# Patient Record
Sex: Female | Born: 1935 | Race: White | Hispanic: No | Marital: Married | Smoking: Never smoker
Health system: Southern US, Community
[De-identification: ages and names within clinical notes are randomized; demographics above are authoritative.]

## PROBLEM LIST (undated history)

## (undated) ENCOUNTER — Emergency Department (HOSPITAL_BASED_OUTPATIENT_CLINIC_OR_DEPARTMENT_OTHER): Admission: EM | Payer: Medicare Other | Source: Home / Self Care

## (undated) DIAGNOSIS — C801 Malignant (primary) neoplasm, unspecified: Secondary | ICD-10-CM

## (undated) DIAGNOSIS — M549 Dorsalgia, unspecified: Secondary | ICD-10-CM

## (undated) DIAGNOSIS — Z923 Personal history of irradiation: Secondary | ICD-10-CM

## (undated) DIAGNOSIS — R197 Diarrhea, unspecified: Secondary | ICD-10-CM

## (undated) DIAGNOSIS — K8689 Other specified diseases of pancreas: Secondary | ICD-10-CM

## (undated) DIAGNOSIS — I10 Essential (primary) hypertension: Secondary | ICD-10-CM

## (undated) DIAGNOSIS — E119 Type 2 diabetes mellitus without complications: Secondary | ICD-10-CM

## (undated) DIAGNOSIS — R110 Nausea: Secondary | ICD-10-CM

## (undated) HISTORY — PX: EYE SURGERY: SHX253

## (undated) HISTORY — PX: DENTAL SURGERY: SHX609

## (undated) HISTORY — PX: TONSILLECTOMY: SUR1361

---
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
## 1999-04-27 ENCOUNTER — Ambulatory Visit (HOSPITAL_COMMUNITY): Admission: RE | Admit: 1999-04-27 | Discharge: 1999-04-27 | Payer: Self-pay | Admitting: *Deleted

## 1999-07-08 ENCOUNTER — Encounter: Admission: RE | Admit: 1999-07-08 | Discharge: 1999-10-06 | Payer: Self-pay | Admitting: Family Medicine

## 1999-11-11 ENCOUNTER — Other Ambulatory Visit: Admission: RE | Admit: 1999-11-11 | Discharge: 1999-11-11 | Payer: Self-pay | Admitting: *Deleted

## 2000-12-01 ENCOUNTER — Other Ambulatory Visit: Admission: RE | Admit: 2000-12-01 | Discharge: 2000-12-01 | Payer: Self-pay | Admitting: *Deleted

## 2002-01-01 ENCOUNTER — Other Ambulatory Visit: Admission: RE | Admit: 2002-01-01 | Discharge: 2002-01-01 | Payer: Self-pay | Admitting: *Deleted

## 2003-01-09 ENCOUNTER — Other Ambulatory Visit: Admission: RE | Admit: 2003-01-09 | Discharge: 2003-01-09 | Payer: Self-pay | Admitting: *Deleted

## 2003-08-21 ENCOUNTER — Ambulatory Visit (HOSPITAL_COMMUNITY): Admission: RE | Admit: 2003-08-21 | Discharge: 2003-08-21 | Payer: Self-pay | Admitting: Gastroenterology

## 2004-07-28 ENCOUNTER — Encounter: Admission: RE | Admit: 2004-07-28 | Discharge: 2004-10-26 | Payer: Self-pay | Admitting: Family Medicine

## 2006-03-04 ENCOUNTER — Other Ambulatory Visit: Admission: RE | Admit: 2006-03-04 | Discharge: 2006-03-04 | Payer: Self-pay | Admitting: Obstetrics and Gynecology

## 2006-03-09 ENCOUNTER — Encounter: Payer: Self-pay | Admitting: *Deleted

## 2013-06-27 ENCOUNTER — Other Ambulatory Visit (HOSPITAL_BASED_OUTPATIENT_CLINIC_OR_DEPARTMENT_OTHER): Payer: Self-pay | Admitting: Family Medicine

## 2013-06-27 DIAGNOSIS — M519 Unspecified thoracic, thoracolumbar and lumbosacral intervertebral disc disorder: Secondary | ICD-10-CM

## 2013-06-30 ENCOUNTER — Ambulatory Visit (HOSPITAL_BASED_OUTPATIENT_CLINIC_OR_DEPARTMENT_OTHER)
Admission: RE | Admit: 2013-06-30 | Discharge: 2013-06-30 | Disposition: A | Discharge status: Home / Self Care | Payer: Medicare Other | Source: Ambulatory Visit | Attending: Family Medicine | Admitting: Family Medicine

## 2013-06-30 DIAGNOSIS — M545 Low back pain, unspecified: Secondary | ICD-10-CM | POA: Insufficient documentation

## 2013-06-30 DIAGNOSIS — M51379 Other intervertebral disc degeneration, lumbosacral region without mention of lumbar back pain or lower extremity pain: Secondary | ICD-10-CM | POA: Insufficient documentation

## 2013-06-30 DIAGNOSIS — M5137 Other intervertebral disc degeneration, lumbosacral region: Secondary | ICD-10-CM | POA: Insufficient documentation

## 2013-06-30 DIAGNOSIS — M519 Unspecified thoracic, thoracolumbar and lumbosacral intervertebral disc disorder: Secondary | ICD-10-CM

## 2013-07-31 ENCOUNTER — Ambulatory Visit: Payer: Medicare Other | Attending: Orthopedic Surgery | Admitting: Physical Therapy

## 2013-07-31 DIAGNOSIS — M545 Low back pain, unspecified: Secondary | ICD-10-CM | POA: Insufficient documentation

## 2013-07-31 DIAGNOSIS — M546 Pain in thoracic spine: Secondary | ICD-10-CM | POA: Insufficient documentation

## 2013-07-31 DIAGNOSIS — M542 Cervicalgia: Secondary | ICD-10-CM | POA: Insufficient documentation

## 2013-07-31 DIAGNOSIS — R5381 Other malaise: Secondary | ICD-10-CM | POA: Insufficient documentation

## 2013-07-31 DIAGNOSIS — IMO0001 Reserved for inherently not codable concepts without codable children: Secondary | ICD-10-CM | POA: Insufficient documentation

## 2013-08-08 ENCOUNTER — Ambulatory Visit: Payer: Medicare Other | Attending: Orthopedic Surgery

## 2013-08-08 DIAGNOSIS — M542 Cervicalgia: Secondary | ICD-10-CM | POA: Insufficient documentation

## 2013-08-08 DIAGNOSIS — M546 Pain in thoracic spine: Secondary | ICD-10-CM | POA: Insufficient documentation

## 2013-08-08 DIAGNOSIS — R5381 Other malaise: Secondary | ICD-10-CM | POA: Insufficient documentation

## 2013-08-08 DIAGNOSIS — IMO0001 Reserved for inherently not codable concepts without codable children: Secondary | ICD-10-CM | POA: Insufficient documentation

## 2013-08-08 DIAGNOSIS — M545 Low back pain, unspecified: Secondary | ICD-10-CM | POA: Insufficient documentation

## 2013-08-16 ENCOUNTER — Ambulatory Visit: Payer: Medicare Other | Admitting: Physical Therapy

## 2013-11-29 ENCOUNTER — Other Ambulatory Visit (HOSPITAL_BASED_OUTPATIENT_CLINIC_OR_DEPARTMENT_OTHER): Payer: Self-pay | Admitting: Family Medicine

## 2013-11-29 DIAGNOSIS — K869 Disease of pancreas, unspecified: Secondary | ICD-10-CM

## 2013-11-30 ENCOUNTER — Ambulatory Visit (HOSPITAL_BASED_OUTPATIENT_CLINIC_OR_DEPARTMENT_OTHER)
Admission: RE | Admit: 2013-11-30 | Discharge: 2013-11-30 | Disposition: A | Discharge status: Home / Self Care | Payer: Medicare Other | Source: Ambulatory Visit | Attending: Family Medicine | Admitting: Family Medicine

## 2013-11-30 ENCOUNTER — Encounter (HOSPITAL_BASED_OUTPATIENT_CLINIC_OR_DEPARTMENT_OTHER): Payer: Self-pay

## 2013-11-30 DIAGNOSIS — I81 Portal vein thrombosis: Secondary | ICD-10-CM | POA: Insufficient documentation

## 2013-11-30 DIAGNOSIS — K869 Disease of pancreas, unspecified: Secondary | ICD-10-CM

## 2013-11-30 HISTORY — DX: Essential (primary) hypertension: I10

## 2013-11-30 HISTORY — DX: Type 2 diabetes mellitus without complications: E11.9

## 2013-11-30 MED ORDER — IOHEXOL 300 MG/ML  SOLN
100.0000 mL | Freq: Once | INTRAMUSCULAR | Status: AC | PRN
  Administered 2013-11-30: 100 mL via INTRAVENOUS

## 2013-12-17 ENCOUNTER — Encounter (INDEPENDENT_AMBULATORY_CARE_PROVIDER_SITE_OTHER): Payer: Self-pay | Admitting: General Surgery

## 2013-12-17 ENCOUNTER — Ambulatory Visit (INDEPENDENT_AMBULATORY_CARE_PROVIDER_SITE_OTHER): Payer: Medicare Other | Admitting: General Surgery

## 2013-12-17 ENCOUNTER — Other Ambulatory Visit (INDEPENDENT_AMBULATORY_CARE_PROVIDER_SITE_OTHER): Payer: Self-pay | Admitting: General Surgery

## 2013-12-17 VITALS — BP 118/62 | HR 99 | Temp 98.0°F | Resp 18 | Ht <= 58 in | Wt 112.4 lb

## 2013-12-17 DIAGNOSIS — C259 Malignant neoplasm of pancreas, unspecified: Secondary | ICD-10-CM

## 2013-12-17 DIAGNOSIS — K8689 Other specified diseases of pancreas: Secondary | ICD-10-CM

## 2013-12-17 DIAGNOSIS — C250 Malignant neoplasm of head of pancreas: Secondary | ICD-10-CM

## 2013-12-17 DIAGNOSIS — K8681 Exocrine pancreatic insufficiency: Secondary | ICD-10-CM

## 2013-12-17 NOTE — Patient Instructions (Signed)
Get chest CT with Forestine Na  We will refer to GI - Dr. Paulita Fujita with [facility] for endoscopic ultrasound and biopsy.  This is in [place] (not available in [place] or [place]) Radiation oncology at [place], [place]   Dr. Tressie Stalker in [place] for medical oncology.    We will refer to genetics for non urgent referral.

## 2013-12-18 ENCOUNTER — Telehealth: Payer: Self-pay | Admitting: Genetic Counselor

## 2013-12-18 DIAGNOSIS — K8681 Exocrine pancreatic insufficiency: Secondary | ICD-10-CM | POA: Insufficient documentation

## 2013-12-18 NOTE — Assessment & Plan Note (Addendum)
Mass appears to have arterial involvement in addition to occlusion of portal vein.   I am skeptical that she will come to resection, but I would like to discuss with radiology at our multidisciplinary conference.  We will also refer for EUS and biopsy.  This can allow [place] a better look at the vascular anatomy, in addition to getting a biopsy.   Her CA 19-9 is reasonably elevated as well (675)   I spoke with Dr. Tressie Stalker of oncology about her, and he is already set up to see her next week.   I reviewed briefly a port a cath with her, so if they discuss that, we can get that set up.   I will get chest CT to rule out thoracic metastatic disease.   I will send her for radiation oncology referral. I also will refer her for non-urgent genetics appt.  She has pancreatic cancer and had mother and sister with breast cancer.  Her brother had some type of intraabdominal cancer. I spent 45 min in evaluation, examination, counseling, and coordination of care.

## 2013-12-18 NOTE — Progress Notes (Signed)
Chief Complaint  Patient presents with  . New Evaluation    pancreatric    HISTORY: Patient is seen in consultation at the request of Betty Martinique for pancreatic cancer. The patient is a 56 are old female who had been having back pain for around a year in her upper back. She states that she had low back pain for many years but that this is definitely different. She thought it was going into her ribs. She also started having weight loss. She has lost around 20 pounds because she gets nausea when she eats. She went to see primary care who referred her to orthopedics. She had evaluation of her spine, and she was told she had arthritis. She feels bloated and gassy all the time. She is also having significant diarrhea. She describes diarrhea as foul-smelling and floating in the toilet. Because of her back pain and nausea, she underwent ultrasound.  Ultrasound demonstrated concern for a pancreatic mass. CT scan confirmed this. She has been a diabetic for around 10 years. She does not think that her blood sugars have changed significantly.  Past Medical History  Diagnosis Date  . Diabetes mellitus without complication   . Hypertension     Past Surgical History  Procedure Laterality Date  . Appendectomy  1956  . Dental surgery    . Humerus fracture surgery w/ implant  1956    Current Outpatient Prescriptions  Medication Sig Dispense Refill  . ACCU-CHEK AVIVA PLUS test strip       . esomeprazole (NEXIUM) 20 MG capsule Take 20 mg by mouth daily at 12 noon.      Marland Kitchen losartan (COZAAR) 50 MG tablet       . metFORMIN (GLUCOPHAGE-XR) 750 MG 24 hr tablet Take 750 mg by mouth daily with breakfast.      . ondansetron (ZOFRAN) 4 MG tablet       . pioglitazone (ACTOS) 15 MG tablet       . rOPINIRole (REQUIP) 0.5 MG tablet       . sodium-potassium bicarbonate (ALKA-SELTZER GOLD) TBEF dissolvable tablet Take 1 tablet by mouth daily as needed.      . traMADol-acetaminophen (ULTRACET) 37.5-325 MG per tablet        . verapamil (CALAN) 80 MG tablet        No current facility-administered medications for this visit.     Allergies  Allergen Reactions  . Codeine Other (See Comments)    Nervous, jerky hands, itching   . Hydrocodone Nausea And Vomiting  . Lisinopril Cough  . Simvastatin Itching     Family History  Problem Relation Age of Onset  . Cancer Mother     breast mets  . Cancer Sister     breast mets to liver   . Diabetes Sister   . Cancer Brother   . Diabetes Brother   . Diabetes Brother      History   Social History  . Marital Status: Married    Spouse Name: N/A    Number of Children: N/A  . Years of Education: N/A   Social History Main Topics  . Smoking status: Light Tobacco Smoker  . Smokeless tobacco: None     Comment: as teen  . Alcohol Use: None  . Drug Use: None  . Sexual Activity: None    REVIEW OF SYSTEMS - PERTINENT POSITIVES ONLY: 12 point review of systems negative other than HPI and PMH except for chills, weight loss, voice change, abdominal pain, constipation, diarrhea,  nausea, arthritis pain, headaches, weakness, confusion  EXAM: Filed Vitals:   12/17/13 1415  BP: 118/62  Pulse: 99  Temp: 98 F (36.7 C)  Resp: 18    Wt Readings from Last 3 Encounters:  12/17/13 112 lb 6.4 oz (50.984 kg)     Gen:  No acute distress.  Well nourished and well groomed.   Neurological: Alert and oriented to person, place, and time. Coordination normal.  Head: Normocephalic and atraumatic.  Eyes: Conjunctivae are normal. Pupils are equal, round, and reactive to light. No scleral icterus.  Neck: Normal range of motion. Neck supple. No tracheal deviation or thyromegaly present.  Cardiovascular: Normal rate, regular rhythm, normal heart sounds and intact distal pulses.  Exam reveals no gallop and no friction rub.  No murmur heard. Respiratory: Effort normal.  No respiratory distress. No chest wall tenderness. Breath sounds normal.  No wheezes, rales or rhonchi.   GI: Soft. Bowel sounds are normal. The abdomen is soft and nontender.  There is no rebound and no guarding.  Musculoskeletal: Normal range of motion. Extremities are nontender.  Lymphadenopathy: No cervical, preauricular, postauricular or axillary adenopathy is present Skin: Skin is warm and dry. No rash noted. No diaphoresis. No erythema. No pallor. No clubbing, cyanosis, or edema.   Psychiatric: Normal affect. Behavior is normal. Judgment and thought content normal. Anxious.     LABORATORY RESULTS: Available labs are reviewed  Gluc 149, remainder of CMET normal, CA 19-9 675   RADIOLOGY RESULTS: See E-Chart or I-Site for most recent results.  Images and reports are reviewed.  Ct Abdomen Pelvis W Contrast  11/30/2013   CLINICAL DATA:  Pancreatic mass  EXAM: CT ABDOMEN AND PELVIS WITH CONTRAST  TECHNIQUE: Multidetector CT imaging of the abdomen and pelvis was performed using the standard protocol following bolus administration of intravenous contrast.  CONTRAST:  127mL OMNIPAQUE IOHEXOL 300 MG/ML  SOLN  COMPARISON:  Ultrasound 11/26/2013  FINDINGS: 3.4 x 2.2 cm mass in the head of the pancreas. It comes into contact with the adventitia of the SMA with slight narrowing. See image 26 There is atrophy and pancreatic dilatation of the body and tail. Findings are worrisome for adenocarcinoma.  Chronic occlusion of the portal vein with re- cannulization secondary to the pancreatic mass.  Possible tiny gallstones.  Liver, spleen, adrenal glands, and left kidney are within normal limits. Tiny nonspecific hypodensity in the lower pole of the right kidney.  Diverticulosis of the sigmoid colon. No evidence of acute diverticulitis. Uterus and bladder are unremarkable. Pelvic varices are present. There is associated reflux in the left ovarian vein.  No destructive bone lesion. Focal sclerosis in the left iliac bone is likely related to arthritis. No acute fracture.  IMPRESSION: New 3.4 cm mass in the head of the  pancreas most consistent with adenocarcinoma. This extends to the SMA with some narrowing. There is also chronic occlusion of the portal vein.  Possible tiny gallstones. A recent ultrasound was negative. This finding is of unknown significance.  Pelvic varices secondary to venous insufficiency of the left ovarian vein.   Electronically Signed   By: Maryclare Bean M.D.   On: 11/30/2013 16:12      ASSESSMENT AND PLAN: Cancer of head of pancreas Mass appears to have arterial involvement in addition to occlusion of portal vein.   I am skeptical that she will come to resection, but I would like to discuss with radiology at our multidisciplinary conference.  We will also refer for EUS and biopsy.  This can allow [place] a better look at the vascular anatomy, in addition to getting a biopsy.   Her CA 19-9 is reasonably elevated as well (675)   I spoke with Dr. Tressie Stalker of oncology about her, and he is already set up to see her next week.   I reviewed briefly a port a cath with her, so if they discuss that, we can get that set up.   I will get chest CT to rule out thoracic metastatic disease.   I will send her for radiation oncology referral. I also will refer her for non-urgent genetics appt.  She has pancreatic cancer and had mother and sister with breast cancer.  Her brother had some type of intraabdominal cancer. I spent 45 min in evaluation, examination, counseling, and coordination of care.     Exocrine pancreatic insufficiency I will add Creon. I gave her a sample to make sure this works. I've advised her to take 2 pills with meals and one with snacks. If this is working we'll write her a prescription.      Milus Height MD Surgical Oncology, General and [facility]      Visit Diagnoses: 1. Cancer of head of pancreas   2. Exocrine pancreatic insufficiency     Primary Care Physician: Abigail Miyamoto, MD  GI - [place]

## 2013-12-18 NOTE — Telephone Encounter (Signed)
PATIENT SCHEDULED FOR GENETIC COUSELING 04/23 @ 1 W/KAREN POWELL.  WELCOME PACKET MAILED.

## 2013-12-18 NOTE — Assessment & Plan Note (Signed)
I will add Creon. I gave her a sample to make sure this works. I've advised her to take 2 pills with meals and one with snacks. If this is working we'll write her a prescription.

## 2013-12-19 ENCOUNTER — Ambulatory Visit (HOSPITAL_COMMUNITY)
Admission: RE | Admit: 2013-12-19 | Discharge: 2013-12-19 | Disposition: A | Discharge status: Home / Self Care | Payer: Medicare Other | Source: Ambulatory Visit | Attending: General Surgery | Admitting: General Surgery

## 2013-12-19 DIAGNOSIS — R059 Cough, unspecified: Secondary | ICD-10-CM | POA: Insufficient documentation

## 2013-12-19 DIAGNOSIS — C259 Malignant neoplasm of pancreas, unspecified: Secondary | ICD-10-CM | POA: Insufficient documentation

## 2013-12-19 DIAGNOSIS — I10 Essential (primary) hypertension: Secondary | ICD-10-CM | POA: Insufficient documentation

## 2013-12-19 DIAGNOSIS — R05 Cough: Secondary | ICD-10-CM | POA: Insufficient documentation

## 2013-12-19 LAB — POCT I-STAT CREATININE: Creatinine, Ser: 0.8 mg/dL (ref 0.50–1.10)

## 2013-12-19 MED ORDER — IOHEXOL 300 MG/ML  SOLN
80.0000 mL | Freq: Once | INTRAMUSCULAR | Status: AC | PRN
  Administered 2013-12-19: 80 mL via INTRAVENOUS

## 2013-12-20 ENCOUNTER — Encounter (HOSPITAL_COMMUNITY): Payer: Self-pay | Admitting: Pharmacy Technician

## 2013-12-20 ENCOUNTER — Telehealth (INDEPENDENT_AMBULATORY_CARE_PROVIDER_SITE_OTHER): Payer: Self-pay

## 2013-12-20 NOTE — Telephone Encounter (Signed)
Pt made aware her chest CT was negative for cancer.

## 2013-12-21 ENCOUNTER — Encounter (HOSPITAL_COMMUNITY): Payer: Self-pay | Admitting: *Deleted

## 2013-12-25 ENCOUNTER — Other Ambulatory Visit: Payer: Self-pay | Admitting: Gastroenterology

## 2013-12-25 NOTE — Addendum Note (Signed)
Addended by: Arta Silence on: 12/25/2013 02:52 PM   Modules accepted: Orders

## 2013-12-26 ENCOUNTER — Encounter (HOSPITAL_COMMUNITY): Payer: Medicare Other | Admitting: Anesthesiology

## 2013-12-26 ENCOUNTER — Ambulatory Visit (HOSPITAL_COMMUNITY)
Admission: RE | Admit: 2013-12-26 | Discharge: 2013-12-26 | Disposition: A | Discharge status: Home / Self Care | Payer: Medicare Other | Source: Ambulatory Visit | Attending: Gastroenterology | Admitting: Gastroenterology

## 2013-12-26 ENCOUNTER — Encounter (HOSPITAL_COMMUNITY)
Admission: RE | Disposition: A | Discharge status: Home / Self Care | Payer: Self-pay | Source: Ambulatory Visit | Attending: Gastroenterology

## 2013-12-26 ENCOUNTER — Ambulatory Visit (HOSPITAL_COMMUNITY): Payer: Medicare Other | Admitting: Anesthesiology

## 2013-12-26 ENCOUNTER — Encounter (HOSPITAL_COMMUNITY): Payer: Self-pay

## 2013-12-26 DIAGNOSIS — Z803 Family history of malignant neoplasm of breast: Secondary | ICD-10-CM | POA: Insufficient documentation

## 2013-12-26 DIAGNOSIS — Z809 Family history of malignant neoplasm, unspecified: Secondary | ICD-10-CM | POA: Insufficient documentation

## 2013-12-26 DIAGNOSIS — I10 Essential (primary) hypertension: Secondary | ICD-10-CM | POA: Insufficient documentation

## 2013-12-26 DIAGNOSIS — E119 Type 2 diabetes mellitus without complications: Secondary | ICD-10-CM | POA: Insufficient documentation

## 2013-12-26 DIAGNOSIS — C250 Malignant neoplasm of head of pancreas: Secondary | ICD-10-CM | POA: Insufficient documentation

## 2013-12-26 DIAGNOSIS — K8689 Other specified diseases of pancreas: Secondary | ICD-10-CM | POA: Insufficient documentation

## 2013-12-26 DIAGNOSIS — Z79899 Other long term (current) drug therapy: Secondary | ICD-10-CM | POA: Insufficient documentation

## 2013-12-26 HISTORY — PX: EUS: SHX5427

## 2013-12-26 HISTORY — DX: Other specified diseases of pancreas: K86.89

## 2013-12-26 LAB — GLUCOSE, CAPILLARY: GLUCOSE-CAPILLARY: 184 mg/dL — AB (ref 70–99)

## 2013-12-26 SURGERY — ESOPHAGEAL ENDOSCOPIC ULTRASOUND (EUS) RADIAL
Anesthesia: Monitor Anesthesia Care

## 2013-12-26 MED ORDER — PROPOFOL 10 MG/ML IV BOLUS
INTRAVENOUS | Status: AC
  Filled 2013-12-26: qty 20

## 2013-12-26 MED ORDER — LACTATED RINGERS IV SOLN
INTRAVENOUS | Status: DC
  Administered 2013-12-26: 1000 mL via INTRAVENOUS

## 2013-12-26 MED ORDER — PROMETHAZINE HCL 25 MG/ML IJ SOLN
6.2500 mg | Freq: Four times a day (QID) | INTRAMUSCULAR | Status: DC | PRN
  Administered 2013-12-26: 6.25 mg via INTRAVENOUS

## 2013-12-26 MED ORDER — PROPOFOL 10 MG/ML IV BOLUS
INTRAVENOUS | Status: DC | PRN
  Administered 2013-12-26: 50 mg via INTRAVENOUS

## 2013-12-26 MED ORDER — PROMETHAZINE HCL 25 MG/ML IJ SOLN
INTRAMUSCULAR | Status: AC
  Filled 2013-12-26: qty 1

## 2013-12-26 MED ORDER — FENTANYL CITRATE 0.05 MG/ML IJ SOLN
INTRAMUSCULAR | Status: AC
  Filled 2013-12-26: qty 2

## 2013-12-26 MED ORDER — FENTANYL CITRATE 0.05 MG/ML IJ SOLN
INTRAMUSCULAR | Status: DC | PRN
  Administered 2013-12-26: 50 ug via INTRAVENOUS
  Administered 2013-12-26: 25 ug via INTRAVENOUS

## 2013-12-26 MED ORDER — SODIUM CHLORIDE 0.9 % IV SOLN: INTRAVENOUS | Status: DC

## 2013-12-26 MED ORDER — ONDANSETRON HCL 4 MG/2ML IJ SOLN
INTRAMUSCULAR | Status: AC
  Filled 2013-12-26: qty 2

## 2013-12-26 MED ORDER — FENTANYL CITRATE 0.05 MG/ML IJ SOLN
25.0000 ug | INTRAMUSCULAR | Status: DC | PRN
  Administered 2013-12-26: 25 ug via INTRAVENOUS

## 2013-12-26 MED ORDER — PROPOFOL INFUSION 10 MG/ML OPTIME
INTRAVENOUS | Status: DC | PRN
  Administered 2013-12-26: 150 ug/kg/min via INTRAVENOUS

## 2013-12-26 MED ORDER — ONDANSETRON HCL 4 MG/2ML IJ SOLN
4.0000 mg | Freq: Once | INTRAMUSCULAR | Status: AC
  Administered 2013-12-26: 4 mg via INTRAVENOUS

## 2013-12-26 NOTE — H&P (View-Only) (Signed)
Chief Complaint  Patient presents with  . New Evaluation    pancreatric    HISTORY: Patient is seen in consultation at the request of Betty Martinique for pancreatic cancer. The patient is a 56 are old female who had been having back pain for around a year in her upper back. She states that she had low back pain for many years but that this is definitely different. She thought it was going into her ribs. She also started having weight loss. She has lost around 20 pounds because she gets nausea when she eats. She went to see primary care who referred her to orthopedics. She had evaluation of her spine, and she was told she had arthritis. She feels bloated and gassy all the time. She is also having significant diarrhea. She describes diarrhea as foul-smelling and floating in the toilet. Because of her back pain and nausea, she underwent ultrasound.  Ultrasound demonstrated concern for a pancreatic mass. CT scan confirmed this. She has been a diabetic for around 10 years. She does not think that her blood sugars have changed significantly.  Past Medical History  Diagnosis Date  . Diabetes mellitus without complication   . Hypertension     Past Surgical History  Procedure Laterality Date  . Appendectomy  1956  . Dental surgery    . Humerus fracture surgery w/ implant  1956    Current Outpatient Prescriptions  Medication Sig Dispense Refill  . ACCU-CHEK AVIVA PLUS test strip       . esomeprazole (NEXIUM) 20 MG capsule Take 20 mg by mouth daily at 12 noon.      Marland Kitchen losartan (COZAAR) 50 MG tablet       . metFORMIN (GLUCOPHAGE-XR) 750 MG 24 hr tablet Take 750 mg by mouth daily with breakfast.      . ondansetron (ZOFRAN) 4 MG tablet       . pioglitazone (ACTOS) 15 MG tablet       . rOPINIRole (REQUIP) 0.5 MG tablet       . sodium-potassium bicarbonate (ALKA-SELTZER GOLD) TBEF dissolvable tablet Take 1 tablet by mouth daily as needed.      . traMADol-acetaminophen (ULTRACET) 37.5-325 MG per tablet        . verapamil (CALAN) 80 MG tablet        No current facility-administered medications for this visit.     Allergies  Allergen Reactions  . Codeine Other (See Comments)    Nervous, jerky hands, itching   . Hydrocodone Nausea And Vomiting  . Lisinopril Cough  . Simvastatin Itching     Family History  Problem Relation Age of Onset  . Cancer Mother     breast mets  . Cancer Sister     breast mets to liver   . Diabetes Sister   . Cancer Brother   . Diabetes Brother   . Diabetes Brother      History   Social History  . Marital Status: Married    Spouse Name: N/A    Number of Children: N/A  . Years of Education: N/A   Social History Main Topics  . Smoking status: Light Tobacco Smoker  . Smokeless tobacco: None     Comment: as teen  . Alcohol Use: None  . Drug Use: None  . Sexual Activity: None    REVIEW OF SYSTEMS - PERTINENT POSITIVES ONLY: 12 point review of systems negative other than HPI and PMH except for chills, weight loss, voice change, abdominal pain, constipation, diarrhea,  nausea, arthritis pain, headaches, weakness, confusion  EXAM: Filed Vitals:   12/17/13 1415  BP: 118/62  Pulse: 99  Temp: 98 F (36.7 C)  Resp: 18    Wt Readings from Last 3 Encounters:  12/17/13 112 lb 6.4 oz (50.984 kg)     Gen:  No acute distress.  Well nourished and well groomed.   Neurological: Alert and oriented to person, place, and time. Coordination normal.  Head: Normocephalic and atraumatic.  Eyes: Conjunctivae are normal. Pupils are equal, round, and reactive to light. No scleral icterus.  Neck: Normal range of motion. Neck supple. No tracheal deviation or thyromegaly present.  Cardiovascular: Normal rate, regular rhythm, normal heart sounds and intact distal pulses.  Exam reveals no gallop and no friction rub.  No murmur heard. Respiratory: Effort normal.  No respiratory distress. No chest wall tenderness. Breath sounds normal.  No wheezes, rales or rhonchi.   GI: Soft. Bowel sounds are normal. The abdomen is soft and nontender.  There is no rebound and no guarding.  Musculoskeletal: Normal range of motion. Extremities are nontender.  Lymphadenopathy: No cervical, preauricular, postauricular or axillary adenopathy is present Skin: Skin is warm and dry. No rash noted. No diaphoresis. No erythema. No pallor. No clubbing, cyanosis, or edema.   Psychiatric: Normal affect. Behavior is normal. Judgment and thought content normal. Anxious.     LABORATORY RESULTS: Available labs are reviewed  Gluc 149, remainder of CMET normal, CA 19-9 675   RADIOLOGY RESULTS: See E-Chart or I-Site for most recent results.  Images and reports are reviewed.  Ct Abdomen Pelvis W Contrast  11/30/2013   CLINICAL DATA:  Pancreatic mass  EXAM: CT ABDOMEN AND PELVIS WITH CONTRAST  TECHNIQUE: Multidetector CT imaging of the abdomen and pelvis was performed using the standard protocol following bolus administration of intravenous contrast.  CONTRAST:  164mL OMNIPAQUE IOHEXOL 300 MG/ML  SOLN  COMPARISON:  Ultrasound 11/26/2013  FINDINGS: 3.4 x 2.2 cm mass in the head of the pancreas. It comes into contact with the adventitia of the SMA with slight narrowing. See image 26 There is atrophy and pancreatic dilatation of the body and tail. Findings are worrisome for adenocarcinoma.  Chronic occlusion of the portal vein with re- cannulization secondary to the pancreatic mass.  Possible tiny gallstones.  Liver, spleen, adrenal glands, and left kidney are within normal limits. Tiny nonspecific hypodensity in the lower pole of the right kidney.  Diverticulosis of the sigmoid colon. No evidence of acute diverticulitis. Uterus and bladder are unremarkable. Pelvic varices are present. There is associated reflux in the left ovarian vein.  No destructive bone lesion. Focal sclerosis in the left iliac bone is likely related to arthritis. No acute fracture.  IMPRESSION: New 3.4 cm mass in the head of the  pancreas most consistent with adenocarcinoma. This extends to the SMA with some narrowing. There is also chronic occlusion of the portal vein.  Possible tiny gallstones. A recent ultrasound was negative. This finding is of unknown significance.  Pelvic varices secondary to venous insufficiency of the left ovarian vein.   Electronically Signed   By: Maryclare Bean M.D.   On: 11/30/2013 16:12      ASSESSMENT AND PLAN: Cancer of head of pancreas Mass appears to have arterial involvement in addition to occlusion of portal vein.   I am skeptical that she will come to resection, but I would like to discuss with radiology at our multidisciplinary conference.  We will also refer for EUS and biopsy.  This can allow [place] a better look at the vascular anatomy, in addition to getting a biopsy.   Her CA 19-9 is reasonably elevated as well (675)   I spoke with Dr. Tressie Stalker of oncology about her, and he is already set up to see her next week.   I reviewed briefly a port a cath with her, so if they discuss that, we can get that set up.   I will get chest CT to rule out thoracic metastatic disease.   I will send her for radiation oncology referral. I also will refer her for non-urgent genetics appt.  She has pancreatic cancer and had mother and sister with breast cancer.  Her brother had some type of intraabdominal cancer. I spent 45 min in evaluation, examination, counseling, and coordination of care.     Exocrine pancreatic insufficiency I will add Creon. I gave her a sample to make sure this works. I've advised her to take 2 pills with meals and one with snacks. If this is working we'll write her a prescription.      Milus Height MD Surgical Oncology, General and [facility]      Visit Diagnoses: 1. Cancer of head of pancreas   2. Exocrine pancreatic insufficiency     Primary Care Physician: Abigail Miyamoto, MD  GI - [place]

## 2013-12-26 NOTE — Transfer of Care (Signed)
Immediate Anesthesia Transfer of Care Note  Patient: Shelley Graves  Procedure(s) Performed: Procedure(s): ESOPHAGEAL ENDOSCOPIC ULTRASOUND (EUS) RADIAL (N/A)  Patient Location: PACU  Anesthesia Type:MAC  Level of Consciousness: awake, alert , oriented and patient cooperative  Airway & Oxygen Therapy: Patient Spontanous Breathing and Patient connected to nasal cannula oxygen  Post-op Assessment: Report given to PACU RN, Post -op Vital signs reviewed and stable and Patient moving all extremities  Post vital signs: Reviewed and stable  Complications: No apparent anesthesia complications

## 2013-12-26 NOTE — Preoperative (Signed)
Beta Blockers   Reason not to administer Beta Blockers:Not Applicable 

## 2013-12-26 NOTE — Op Note (Signed)
[facility] [place], [address]   ENDOSCOPIC ULTRASOUND PROCEDURE REPORT  PATIENT: Shelley, Graves  MR#: [id number] BIRTHDATE: 07/15/1935  GENDER: Female ENDOSCOPIST: Arta Silence, MD REFERRED BY:  Stark Klein, M.D.  Briscoe Deutscher, M.D. PROCEDURE DATE:  12/26/2013 PROCEDURE:   Upper EUS w/FNA ASA CLASS:      Class III INDICATIONS:   1.  pancreatic mass. MEDICATIONS: MAC sedation, administered by CRNA  DESCRIPTION OF PROCEDURE:   After the risks benefits and alternatives of the procedure were  explained, informed consent was obtained. The patient was then placed in the left, lateral, decubitus postion and IV sedation was administered. Throughout the procedure, the patients blood pressure, pulse and oxygen saturations were monitored continuously.  Under direct visualization, the Pentax EUS Linear [id number]  endoscope was introduced through the mouth  and advanced to the second portion of the duodenum .  Water was used as necessary to provide an acoustic interface.  Upon completion of the imaging, water was removed and the patient was sent to the recovery room in satisfactory condition.   FINDINGS:      Hypoechoic ill-defined mass, with interspersed calcifications, in the head of pancreas, about 33 x 41mm in size. Mass appears to obliterate part of SMV and invades the SMA.  There is some neighboring adenopathy.  Pancreatic duct is dilated upstream of mass.  Mass biopsied x 5 with 25g needle.  IMPRESSION:     As above.  Successful biopsy of pancreatic head mass.  RECOMMENDATIONS:     1.  Watch for potential complications of procedure. 2.  Await final cytology results. 3.  Will discuss case with Dr. Barry Dienes.   _______________________________ Lorrin MaisArta Silence, MD 12/26/2013 11:37 AM   CC:

## 2013-12-26 NOTE — Interval H&P Note (Signed)
History and Physical Interval Note:  12/26/2013 10:24 AM  Shelley Graves  has presented today for surgery, with the diagnosis of pancreatic mass  The various methods of treatment have been discussed with the patient and family. After consideration of risks, benefits and other options for treatment, the patient has consented to  Procedure(s): ESOPHAGEAL ENDOSCOPIC ULTRASOUND (EUS) RADIAL (N/A) as a surgical intervention .  The patient's history has been reviewed, patient examined, no change in status, stable for surgery.  I have reviewed the patient's chart and labs.  Questions were answered to the patient's satisfaction.     Alezander Dimaano M  Assessment:  1.  Pancreatic mass.  Elevated Ca 19-9.  Concern for adenocarcinoma.  Plan:  1.  Endoscopic ultrasound with possible fine needle aspiration (FNA). 2.  Risks (bleeding, infection, bowel perforation that could require surgery, sedation-related changes in cardiopulmonary systems), benefits (identification and possible treatment of source of symptoms, exclusion of certain causes of symptoms), and alternatives (watchful waiting, radiographic imaging studies, empiric medical treatment) of upper endoscopy with ultrasound and possible fine needle aspiration biopsies (EUS +/- FNA) were explained to patient/family in detail and patient wishes to proceed.

## 2013-12-26 NOTE — Discharge Instructions (Signed)
Endoscopic Ultrasound (EUS)  Care After  Please read the instructions outlined below and refer to this sheet in the next few weeks. These discharge instructions provide you with general information on caring for yourself after you leave the hospital. Your doctor may also give you specific instructions. While your treatment has been planned according to the most current medical practices available, unavoidable complications occasionally occur. If you have any problems or questions after discharge, please call Dr. Pavielle Biggar ([facility]) at [phone].  HOME CARE INSTRUCTIONS  Activity You may resume your regular activity but move at a slower pace for the next 24 hours.  Take frequent rest periods for the next 24 hours.  Walking will help expel (get rid of) the air and reduce the bloated feeling in your abdomen.  No driving for 24 hours (because of the anesthesia (medicine) used during the test).  You may shower.  Do not sign any important legal documents or operate any machinery for 24 hours (because of the anesthesia used during the test).  Nutrition Drink plenty of fluids.  You may resume your normal diet.  Begin with a light meal and progress to your normal diet.  Avoid alcoholic beverages for 24 hours or as instructed by your caregiver.   Medications You may resume your normal medications unless your caregiver tells you otherwise.  What you can expect today You may experience abdominal discomfort such as a feeling of fullness or "gas" pains.  You may experience a sore throat for 2 to 3 days. This is normal. Gargling with salt water may help this.   SEEK IMMEDIATE MEDICAL CARE IF: You have excessive nausea (feeling sick to your stomach) and/or vomiting.  You have severe abdominal pain and distention (swelling).  You have trouble swallowing.  You have a temperature over 100 F (37.8 C).  You have rectal bleeding or vomiting of blood.   Document Released: 06/08/2004  Document Revised: 07/07/2011 Document Reviewed: 12/20/2007 Exit[employer] Patient Information 2012 [employer].  

## 2013-12-26 NOTE — OR Nursing (Signed)
Patient complaining of nausea and pain in mid back and chest 6/10.  Procedural MD and anesthesiologist notified and orders given for phenergan 6.25 and fentanyl 25 which was given.  Patient states that she has this pain at home that is on-going.  Will continue to monitor.

## 2013-12-26 NOTE — Anesthesia Postprocedure Evaluation (Signed)
  Anesthesia Post-op Note  Patient: Shelley Graves  Procedure(s) Performed: Procedure(s) (LRB): ESOPHAGEAL ENDOSCOPIC ULTRASOUND (EUS) RADIAL (N/A)  Patient Location: PACU  Anesthesia Type: MAC  Level of Consciousness: awake and alert   Airway and Oxygen Therapy: Patient Spontanous Breathing  Post-op Pain: mild  Post-op Assessment: Post-op Vital signs reviewed, Patient's Cardiovascular Status Stable, Respiratory Function Stable, Patent Airway and No signs of Nausea or vomiting  Last Vitals:  Filed Vitals:   12/26/13 1200  BP: 153/75  Temp:   Resp: 24    Post-op Vital Signs: stable   Complications: No apparent anesthesia complications

## 2013-12-26 NOTE — Anesthesia Preprocedure Evaluation (Addendum)
Anesthesia Evaluation  Patient identified by MRN, date of birth, ID band Patient awake    Reviewed: Allergy & Precautions, H&P , NPO status , Patient's Chart, lab work & pertinent test results  Airway Mallampati: II TM Distance: >3 FB Neck ROM: Full    Dental  (+) Upper Dentures, Lower Dentures   Pulmonary neg pulmonary ROS,  breath sounds clear to auscultation  Pulmonary exam normal       Cardiovascular Exercise Tolerance: Good hypertension, Pt. on medications Rhythm:Regular Rate:Normal     Neuro/Psych negative neurological ROS  negative psych ROS   GI/Hepatic negative GI ROS, Neg liver ROS,   Endo/Other  diabetes, Type 2, Oral Hypoglycemic Agents  Renal/GU negative Renal ROS  negative genitourinary   Musculoskeletal negative musculoskeletal ROS (+)   Abdominal   Peds negative pediatric ROS (+)  Hematology negative hematology ROS (+)   Anesthesia Other Findings   Reproductive/Obstetrics negative OB ROS                          Anesthesia Physical Anesthesia Plan  ASA: III  Anesthesia Plan: MAC   Post-op Pain Management:    Induction: Intravenous  Airway Management Planned:   Additional Equipment:   Intra-op Plan:   Post-operative Plan:   Informed Consent: I have reviewed the patients History and Physical, chart, labs and discussed the procedure including the risks, benefits and alternatives for the proposed anesthesia with the patient or authorized representative who has indicated his/her understanding and acceptance.   Dental advisory given  Plan Discussed with: CRNA  Anesthesia Plan Comments:         Anesthesia Quick Evaluation

## 2013-12-27 ENCOUNTER — Encounter (HOSPITAL_COMMUNITY): Payer: Self-pay | Admitting: Gastroenterology

## 2014-01-02 ENCOUNTER — Other Ambulatory Visit (INDEPENDENT_AMBULATORY_CARE_PROVIDER_SITE_OTHER): Payer: Self-pay | Admitting: General Surgery

## 2014-01-04 ENCOUNTER — Encounter (HOSPITAL_COMMUNITY): Payer: Self-pay | Admitting: Pharmacy Technician

## 2014-01-09 NOTE — Pre-Procedure Instructions (Signed)
Shelley Graves  01/09/2014   Your procedure is scheduled on:  Friday, March 5.  Report to [facility], Main Entrance or Entrance "A" at 5:30AM.  Call this number if you have problems the morning of surgery: [phone]   Remember:   Do not eat food or drink liquids after midnight.   Take these medicines the morning of surgery with A SIP OF WATER: verapamil (CALAN).              Take if needed :traMADol-acetaminophen (ULTRACET), OR :oxyCODONE-acetaminophen (PERCOCET/ROXICET), ondansetron (ZOFRAN).  Stop taking Aspirin, Coumadin, Plavix, Effient and Herbal medications.  Don not take any NSAIDs ie: Ibuprofen,  Advil,Naproxen or any medication containing Aspirin.  Do not wear jewelry, make-up or nail polish.  Do not wear lotions, powders, or perfumes.   Do not shave 48 hours prior to surgery.   Do not bring valuables to the hospital.  [facility] is not responsible for any belongings or valuables.               Contacts, dentures or bridgework may not be worn into surgery.  Leave suitcase in the car. After surgery it may be brought to your room.  For patients admitted to the hospital, discharge time is determined by your treatment team.               Patients discharged the day of surgery will not be allowed to drive home.  Name and phone number of your driver: -   Special Instructions: Review  [facility] - Preparing For Surgery.   Please read over the following fact sheets that you were given: Pain Booklet, Coughing and Deep Breathing and Surgical Site Infection Prevention

## 2014-01-10 ENCOUNTER — Encounter (HOSPITAL_COMMUNITY)
Admission: RE | Admit: 2014-01-10 | Discharge: 2014-01-10 | Disposition: A | Discharge status: Home / Self Care | Payer: Medicare Other | Source: Ambulatory Visit | Attending: Anesthesiology | Admitting: Anesthesiology

## 2014-01-10 ENCOUNTER — Encounter (HOSPITAL_COMMUNITY): Payer: Self-pay

## 2014-01-10 ENCOUNTER — Encounter (HOSPITAL_COMMUNITY)
Admission: RE | Admit: 2014-01-10 | Discharge: 2014-01-10 | Disposition: A | Discharge status: Home / Self Care | Payer: Medicare Other | Source: Ambulatory Visit | Attending: General Surgery | Admitting: General Surgery

## 2014-01-10 HISTORY — DX: Dorsalgia, unspecified: M54.9

## 2014-01-10 HISTORY — DX: Personal history of irradiation: Z92.3

## 2014-01-10 HISTORY — DX: Nausea: R11.0

## 2014-01-10 HISTORY — DX: Diarrhea, unspecified: R19.7

## 2014-01-10 HISTORY — DX: Malignant (primary) neoplasm, unspecified: C80.1

## 2014-01-10 LAB — BASIC METABOLIC PANEL
BUN: 7 mg/dL (ref 6–23)
CHLORIDE: 97 meq/L (ref 96–112)
CO2: 26 mEq/L (ref 19–32)
Calcium: 9.7 mg/dL (ref 8.4–10.5)
Creatinine, Ser: 0.7 mg/dL (ref 0.50–1.10)
GFR calc Af Amer: >90 mL/min (ref >90)
GFR calc non Af Amer: 82 mL/min — ABNORMAL LOW (ref >90)
Glucose, Bld: 199 mg/dL — ABNORMAL HIGH (ref 70–99)
Potassium: 4.7 mEq/L (ref 3.7–5.3)
Sodium: 137 mEq/L (ref 137–147)

## 2014-01-10 LAB — CBC
HEMATOCRIT: 38.1 % (ref 36.0–46.0)
Hemoglobin: 12.6 g/dL (ref 12.0–15.0)
MCH: 28.1 pg (ref 26.0–34.0)
MCHC: 33.1 g/dL (ref 30.0–36.0)
MCV: 85 fL (ref 78.0–100.0)
Platelets: 269 10*3/uL (ref 150–400)
RBC: 4.48 MIL/uL (ref 3.87–5.11)
RDW: 13.8 % (ref 11.5–15.5)
WBC: 5.6 10*3/uL (ref 4.0–10.5)

## 2014-01-10 MED ORDER — CEFAZOLIN SODIUM-DEXTROSE 2-3 GM-% IV SOLR
2.0000 g | INTRAVENOUS | Status: AC
  Administered 2014-01-11: 2 g via INTRAVENOUS
  Filled 2014-01-10: qty 50

## 2014-01-11 ENCOUNTER — Encounter (HOSPITAL_COMMUNITY)
Admission: RE | Disposition: A | Discharge status: Home / Self Care | Payer: Self-pay | Source: Ambulatory Visit | Attending: General Surgery

## 2014-01-11 ENCOUNTER — Ambulatory Visit (HOSPITAL_COMMUNITY): Payer: Medicare Other

## 2014-01-11 ENCOUNTER — Ambulatory Visit (HOSPITAL_COMMUNITY): Payer: Medicare Other | Admitting: Anesthesiology

## 2014-01-11 ENCOUNTER — Encounter (HOSPITAL_COMMUNITY): Payer: Medicare Other | Admitting: Anesthesiology

## 2014-01-11 ENCOUNTER — Encounter (HOSPITAL_COMMUNITY): Payer: Self-pay | Admitting: *Deleted

## 2014-01-11 ENCOUNTER — Ambulatory Visit (HOSPITAL_COMMUNITY)
Admission: RE | Admit: 2014-01-11 | Discharge: 2014-01-11 | Disposition: A | Discharge status: Home / Self Care | Payer: Medicare Other | Source: Ambulatory Visit | Attending: General Surgery | Admitting: General Surgery

## 2014-01-11 DIAGNOSIS — C259 Malignant neoplasm of pancreas, unspecified: Secondary | ICD-10-CM

## 2014-01-11 DIAGNOSIS — I10 Essential (primary) hypertension: Secondary | ICD-10-CM | POA: Insufficient documentation

## 2014-01-11 DIAGNOSIS — Z452 Encounter for adjustment and management of vascular access device: Secondary | ICD-10-CM | POA: Insufficient documentation

## 2014-01-11 DIAGNOSIS — Z885 Allergy status to narcotic agent status: Secondary | ICD-10-CM | POA: Insufficient documentation

## 2014-01-11 DIAGNOSIS — E119 Type 2 diabetes mellitus without complications: Secondary | ICD-10-CM | POA: Insufficient documentation

## 2014-01-11 HISTORY — PX: PORTACATH PLACEMENT: SHX2246

## 2014-01-11 LAB — GLUCOSE, CAPILLARY
GLUCOSE-CAPILLARY: 116 mg/dL — AB (ref 70–99)
Glucose-Capillary: 114 mg/dL — ABNORMAL HIGH (ref 70–99)

## 2014-01-11 SURGERY — INSERTION, TUNNELED CENTRAL VENOUS DEVICE, WITH PORT
Anesthesia: General

## 2014-01-11 MED ORDER — SODIUM CHLORIDE 0.9 % IJ SOLN
INTRAMUSCULAR | Status: AC
  Filled 2014-01-11: qty 10

## 2014-01-11 MED ORDER — LIDOCAINE HCL (CARDIAC) 20 MG/ML IV SOLN
INTRAVENOUS | Status: AC
  Filled 2014-01-11: qty 5

## 2014-01-11 MED ORDER — LACTATED RINGERS IV SOLN
INTRAVENOUS | Status: DC | PRN
  Administered 2014-01-11: 07:00:00 via INTRAVENOUS

## 2014-01-11 MED ORDER — LIDOCAINE HCL (CARDIAC) 20 MG/ML IV SOLN
INTRAVENOUS | Status: DC | PRN
Start: 2014-01-11 — End: 2014-01-11
  Administered 2014-01-11: 60 mg via INTRAVENOUS

## 2014-01-11 MED ORDER — ACETAMINOPHEN 325 MG PO TABS: 650.0000 mg | ORAL_TABLET | ORAL | Status: DC | PRN

## 2014-01-11 MED ORDER — ACETAMINOPHEN 650 MG RE SUPP: 650.0000 mg | RECTAL | Status: DC | PRN

## 2014-01-11 MED ORDER — PHENYLEPHRINE 40 MCG/ML (10ML) SYRINGE FOR IV PUSH (FOR BLOOD PRESSURE SUPPORT)
PREFILLED_SYRINGE | INTRAVENOUS | Status: AC
  Filled 2014-01-11: qty 10

## 2014-01-11 MED ORDER — OXYCODONE-ACETAMINOPHEN 5-325 MG PO TABS: 1.0000 | ORAL_TABLET | ORAL | Status: AC | PRN

## 2014-01-11 MED ORDER — PROPOFOL 10 MG/ML IV BOLUS
INTRAVENOUS | Status: AC
  Filled 2014-01-11: qty 20

## 2014-01-11 MED ORDER — SODIUM CHLORIDE 0.9 % IJ SOLN: 3.0000 mL | Freq: Two times a day (BID) | INTRAMUSCULAR | Status: DC

## 2014-01-11 MED ORDER — ONDANSETRON HCL 4 MG/2ML IJ SOLN
INTRAMUSCULAR | Status: DC | PRN
  Administered 2014-01-11: 4 mg via INTRAVENOUS

## 2014-01-11 MED ORDER — CHLORHEXIDINE GLUCONATE 4 % EX LIQD: 1.0000 "application " | Freq: Once | CUTANEOUS | Status: DC

## 2014-01-11 MED ORDER — MIDAZOLAM HCL 5 MG/5ML IJ SOLN
INTRAMUSCULAR | Status: DC | PRN
  Administered 2014-01-11: 2 mg via INTRAVENOUS

## 2014-01-11 MED ORDER — ONDANSETRON HCL 4 MG/2ML IJ SOLN: 4.0000 mg | Freq: Once | INTRAMUSCULAR | Status: DC | PRN

## 2014-01-11 MED ORDER — FENTANYL CITRATE 0.05 MG/ML IJ SOLN
INTRAMUSCULAR | Status: DC | PRN
  Administered 2014-01-11: 25 ug via INTRAVENOUS

## 2014-01-11 MED ORDER — ACETAMINOPHEN 160 MG/5ML PO SOLN
325.0000 mg | ORAL | Status: DC | PRN
  Filled 2014-01-11: qty 20.3

## 2014-01-11 MED ORDER — LIDOCAINE HCL 1 % IJ SOLN
INTRAMUSCULAR | Status: DC | PRN
  Administered 2014-01-11: 08:00:00 via SUBCUTANEOUS

## 2014-01-11 MED ORDER — ACETAMINOPHEN 325 MG PO TABS: 325.0000 mg | ORAL_TABLET | ORAL | Status: DC | PRN

## 2014-01-11 MED ORDER — FENTANYL CITRATE 0.05 MG/ML IJ SOLN: 25.0000 ug | INTRAMUSCULAR | Status: DC | PRN

## 2014-01-11 MED ORDER — MIDAZOLAM HCL 2 MG/2ML IJ SOLN
INTRAMUSCULAR | Status: AC
  Filled 2014-01-11: qty 2

## 2014-01-11 MED ORDER — OXYCODONE HCL 5 MG PO TABS: 5.0000 mg | ORAL_TABLET | ORAL | Status: DC | PRN

## 2014-01-11 MED ORDER — PROPOFOL 10 MG/ML IV EMUL
INTRAVENOUS | Status: AC
  Filled 2014-01-11: qty 50

## 2014-01-11 MED ORDER — BUPIVACAINE-EPINEPHRINE (PF) 0.25% -1:200000 IJ SOLN
INTRAMUSCULAR | Status: AC
  Filled 2014-01-11: qty 30

## 2014-01-11 MED ORDER — HEPARIN SOD (PORK) LOCK FLUSH 100 UNIT/ML IV SOLN
INTRAVENOUS | Status: DC | PRN
  Administered 2014-01-11: 500 [IU] via INTRAVENOUS

## 2014-01-11 MED ORDER — SODIUM CHLORIDE 0.9 % IJ SOLN
3.0000 mL | INTRAMUSCULAR | Status: DC | PRN
Start: 2014-01-11 — End: 2014-01-11

## 2014-01-11 MED ORDER — PROPOFOL 10 MG/ML IV BOLUS
INTRAVENOUS | Status: DC | PRN
  Administered 2014-01-11: 140 mg via INTRAVENOUS

## 2014-01-11 MED ORDER — HEPARIN SOD (PORK) LOCK FLUSH 100 UNIT/ML IV SOLN
INTRAVENOUS | Status: AC
  Filled 2014-01-11: qty 5

## 2014-01-11 MED ORDER — HEPARIN SODIUM (PORCINE) 5000 UNIT/ML IJ SOLN
INTRAMUSCULAR | Status: DC | PRN
  Administered 2014-01-11: 08:00:00

## 2014-01-11 MED ORDER — ONDANSETRON HCL 4 MG/2ML IJ SOLN
INTRAMUSCULAR | Status: AC
  Filled 2014-01-11: qty 2

## 2014-01-11 MED ORDER — EPHEDRINE SULFATE 50 MG/ML IJ SOLN
INTRAMUSCULAR | Status: AC
  Filled 2014-01-11: qty 1

## 2014-01-11 MED ORDER — PHENYLEPHRINE HCL 10 MG/ML IJ SOLN
INTRAMUSCULAR | Status: DC | PRN
  Administered 2014-01-11: 120 ug via INTRAVENOUS
  Administered 2014-01-11 (×2): 160 ug via INTRAVENOUS

## 2014-01-11 MED ORDER — LIDOCAINE HCL (PF) 1 % IJ SOLN
INTRAMUSCULAR | Status: AC
  Filled 2014-01-11: qty 30

## 2014-01-11 MED ORDER — SODIUM CHLORIDE 0.9 % IV SOLN
250.0000 mL | INTRAVENOUS | Status: DC | PRN
Start: 2014-01-11 — End: 2014-01-11

## 2014-01-11 MED ORDER — FENTANYL CITRATE 0.05 MG/ML IJ SOLN
INTRAMUSCULAR | Status: AC
  Filled 2014-01-11: qty 5

## 2014-01-11 MED ORDER — SUCCINYLCHOLINE CHLORIDE 20 MG/ML IJ SOLN
INTRAMUSCULAR | Status: AC
  Filled 2014-01-11: qty 1

## 2014-01-11 MED ORDER — ONDANSETRON HCL 4 MG/2ML IJ SOLN: 4.0000 mg | Freq: Four times a day (QID) | INTRAMUSCULAR | Status: DC | PRN

## 2014-01-11 SURGICAL SUPPLY — 51 items
BAG DECANTER FOR FLEXI CONT (MISCELLANEOUS) ×3 IMPLANT
BLADE SURG 11 STRL SS (BLADE) ×6 IMPLANT
BLADE SURG 15 STRL LF DISP TIS (BLADE) ×1 IMPLANT
BLADE SURG 15 STRL SS (BLADE) ×2
CANISTER SUCTION 2500CC (MISCELLANEOUS) ×3 IMPLANT
CHLORAPREP W/TINT 10.5 ML (MISCELLANEOUS) ×3 IMPLANT
COVER SURGICAL LIGHT HANDLE (MISCELLANEOUS) ×3 IMPLANT
COVER TRANSDUCER ULTRASND (DRAPES) IMPLANT
CRADLE DONUT ADULT HEAD (MISCELLANEOUS) ×3 IMPLANT
DECANTER SPIKE VIAL GLASS SM (MISCELLANEOUS) ×6 IMPLANT
DERMABOND ADVANCED (GAUZE/BANDAGES/DRESSINGS) ×2
DERMABOND ADVANCED .7 DNX12 (GAUZE/BANDAGES/DRESSINGS) ×1 IMPLANT
DRAPE C-ARM 42X72 X-RAY (DRAPES) ×3 IMPLANT
DRAPE CHEST BREAST 15X10 FENES (DRAPES) ×3 IMPLANT
DRAPE UTILITY 15X26 W/TAPE STR (DRAPE) ×6 IMPLANT
DRAPE WARM FLUID 44X44 (DRAPE) IMPLANT
DRSG TEGADERM 4X4.75 (GAUZE/BANDAGES/DRESSINGS) ×12 IMPLANT
ELECT COATED BLADE 2.86 ST (ELECTRODE) ×3 IMPLANT
ELECT REM PT RETURN 9FT ADLT (ELECTROSURGICAL) ×3
ELECTRODE REM PT RTRN 9FT ADLT (ELECTROSURGICAL) ×1 IMPLANT
GAUZE SPONGE 4X4 16PLY XRAY LF (GAUZE/BANDAGES/DRESSINGS) ×3 IMPLANT
GLOVE BIO SURGEON STRL SZ 6 (GLOVE) ×3 IMPLANT
GLOVE BIOGEL PI IND STRL 6.5 (GLOVE) ×1 IMPLANT
GLOVE BIOGEL PI INDICATOR 6.5 (GLOVE) ×2
GOWN STRL NON-REIN LRG LVL3 (GOWN DISPOSABLE) ×3 IMPLANT
GOWN STRL REUS W/TWL 2XL LVL3 (GOWN DISPOSABLE) ×3 IMPLANT
KIT BASIN OR (CUSTOM PROCEDURE TRAY) ×3 IMPLANT
KIT PORT POWER 8FR ISP CVUE (Catheter) ×3 IMPLANT
KIT PORT POWER 9.6FR MRI PREA (Catheter) IMPLANT
KIT PORT POWER ISP 8FR (Catheter) IMPLANT
KIT POWER CATH 8FR (Catheter) IMPLANT
KIT ROOM TURNOVER OR (KITS) ×3 IMPLANT
NEEDLE HYPO 25GX1X1/2 BEV (NEEDLE) ×3 IMPLANT
NS IRRIG 1000ML POUR BTL (IV SOLUTION) ×3 IMPLANT
PACK SURGICAL SETUP 50X90 (CUSTOM PROCEDURE TRAY) ×3 IMPLANT
PAD ARMBOARD 7.5X6 YLW CONV (MISCELLANEOUS) ×3 IMPLANT
PENCIL BUTTON HOLSTER BLD 10FT (ELECTRODE) ×3 IMPLANT
SPONGE GAUZE 4X4 12PLY (GAUZE/BANDAGES/DRESSINGS) ×6 IMPLANT
SUT MON AB 4-0 PC3 18 (SUTURE) ×3 IMPLANT
SUT PROLENE 2 0 SH DA (SUTURE) ×6 IMPLANT
SUT VIC AB 3-0 SH 27 (SUTURE) ×2
SUT VIC AB 3-0 SH 27X BRD (SUTURE) ×1 IMPLANT
SYR 20ML ECCENTRIC (SYRINGE) ×6 IMPLANT
SYR 5ML LUER SLIP (SYRINGE) ×3 IMPLANT
SYR CONTROL 10ML LL (SYRINGE) ×3 IMPLANT
TOWEL OR 17X24 6PK STRL BLUE (TOWEL DISPOSABLE) ×3 IMPLANT
TOWEL OR 17X26 10 PK STRL BLUE (TOWEL DISPOSABLE) ×3 IMPLANT
TUBE CONNECTING 12'X1/4 (SUCTIONS) ×1
TUBE CONNECTING 12X1/4 (SUCTIONS) ×2 IMPLANT
WATER STERILE IRR 1000ML POUR (IV SOLUTION) IMPLANT
YANKAUER SUCT BULB TIP NO VENT (SUCTIONS) ×3 IMPLANT

## 2014-01-11 NOTE — Anesthesia Postprocedure Evaluation (Signed)
  Anesthesia Post-op Note  Patient: Shelley Graves  Procedure(s) Performed: Procedure(s): INSERTION PORT-A-CATH (N/A)  Patient Location: PACU  Anesthesia Type:General  Level of Consciousness: awake, alert  and oriented  Airway and Oxygen Therapy: Patient Spontanous Breathing  Post-op Pain: mild  Post-op Assessment: Post-op Vital signs reviewed, Patient's Cardiovascular Status Stable, Respiratory Function Stable, Patent Airway, No signs of Nausea or vomiting and Pain level controlled  Post-op Vital Signs: Reviewed and stable  Complications: No apparent anesthesia complications

## 2014-01-11 NOTE — H&P (View-Only) (Signed)
Chief Complaint  Patient presents with  . New Evaluation    pancreatric    HISTORY: Patient is seen in consultation at the request of Betty Martinique for pancreatic cancer. The patient is a 56 are old female who had been having back pain for around a year in her upper back. She states that she had low back pain for many years but that this is definitely different. She thought it was going into her ribs. She also started having weight loss. She has lost around 20 pounds because she gets nausea when she eats. She went to see primary care who referred her to orthopedics. She had evaluation of her spine, and she was told she had arthritis. She feels bloated and gassy all the time. She is also having significant diarrhea. She describes diarrhea as foul-smelling and floating in the toilet. Because of her back pain and nausea, she underwent ultrasound.  Ultrasound demonstrated concern for a pancreatic mass. CT scan confirmed this. She has been a diabetic for around 10 years. She does not think that her blood sugars have changed significantly.  Past Medical History  Diagnosis Date  . Diabetes mellitus without complication   . Hypertension     Past Surgical History  Procedure Laterality Date  . Appendectomy  1956  . Dental surgery    . Humerus fracture surgery w/ implant  1956    Current Outpatient Prescriptions  Medication Sig Dispense Refill  . ACCU-CHEK AVIVA PLUS test strip       . esomeprazole (NEXIUM) 20 MG capsule Take 20 mg by mouth daily at 12 noon.      Marland Kitchen losartan (COZAAR) 50 MG tablet       . metFORMIN (GLUCOPHAGE-XR) 750 MG 24 hr tablet Take 750 mg by mouth daily with breakfast.      . ondansetron (ZOFRAN) 4 MG tablet       . pioglitazone (ACTOS) 15 MG tablet       . rOPINIRole (REQUIP) 0.5 MG tablet       . sodium-potassium bicarbonate (ALKA-SELTZER GOLD) TBEF dissolvable tablet Take 1 tablet by mouth daily as needed.      . traMADol-acetaminophen (ULTRACET) 37.5-325 MG per tablet        . verapamil (CALAN) 80 MG tablet        No current facility-administered medications for this visit.     Allergies  Allergen Reactions  . Codeine Other (See Comments)    Nervous, jerky hands, itching   . Hydrocodone Nausea And Vomiting  . Lisinopril Cough  . Simvastatin Itching     Family History  Problem Relation Age of Onset  . Cancer Mother     breast mets  . Cancer Sister     breast mets to liver   . Diabetes Sister   . Cancer Brother   . Diabetes Brother   . Diabetes Brother      History   Social History  . Marital Status: Married    Spouse Name: N/A    Number of Children: N/A  . Years of Education: N/A   Social History Main Topics  . Smoking status: Light Tobacco Smoker  . Smokeless tobacco: None     Comment: as teen  . Alcohol Use: None  . Drug Use: None  . Sexual Activity: None    REVIEW OF SYSTEMS - PERTINENT POSITIVES ONLY: 12 point review of systems negative other than HPI and PMH except for chills, weight loss, voice change, abdominal pain, constipation, diarrhea,  nausea, arthritis pain, headaches, weakness, confusion  EXAM: Filed Vitals:   12/17/13 1415  BP: 118/62  Pulse: 99  Temp: 98 F (36.7 C)  Resp: 18    Wt Readings from Last 3 Encounters:  12/17/13 112 lb 6.4 oz (50.984 kg)     Gen:  No acute distress.  Well nourished and well groomed.   Neurological: Alert and oriented to person, place, and time. Coordination normal.  Head: Normocephalic and atraumatic.  Eyes: Conjunctivae are normal. Pupils are equal, round, and reactive to light. No scleral icterus.  Neck: Normal range of motion. Neck supple. No tracheal deviation or thyromegaly present.  Cardiovascular: Normal rate, regular rhythm, normal heart sounds and intact distal pulses.  Exam reveals no gallop and no friction rub.  No murmur heard. Respiratory: Effort normal.  No respiratory distress. No chest wall tenderness. Breath sounds normal.  No wheezes, rales or rhonchi.   GI: Soft. Bowel sounds are normal. The abdomen is soft and nontender.  There is no rebound and no guarding.  Musculoskeletal: Normal range of motion. Extremities are nontender.  Lymphadenopathy: No cervical, preauricular, postauricular or axillary adenopathy is present Skin: Skin is warm and dry. No rash noted. No diaphoresis. No erythema. No pallor. No clubbing, cyanosis, or edema.   Psychiatric: Normal affect. Behavior is normal. Judgment and thought content normal. Anxious.     LABORATORY RESULTS: Available labs are reviewed  Gluc 149, remainder of CMET normal, CA 19-9 675   RADIOLOGY RESULTS: See E-Chart or I-Site for most recent results.  Images and reports are reviewed.  Ct Abdomen Pelvis W Contrast  11/30/2013   CLINICAL DATA:  Pancreatic mass  EXAM: CT ABDOMEN AND PELVIS WITH CONTRAST  TECHNIQUE: Multidetector CT imaging of the abdomen and pelvis was performed using the standard protocol following bolus administration of intravenous contrast.  CONTRAST:  179mL OMNIPAQUE IOHEXOL 300 MG/ML  SOLN  COMPARISON:  Ultrasound 11/26/2013  FINDINGS: 3.4 x 2.2 cm mass in the head of the pancreas. It comes into contact with the adventitia of the SMA with slight narrowing. See image 26 There is atrophy and pancreatic dilatation of the body and tail. Findings are worrisome for adenocarcinoma.  Chronic occlusion of the portal vein with re- cannulization secondary to the pancreatic mass.  Possible tiny gallstones.  Liver, spleen, adrenal glands, and left kidney are within normal limits. Tiny nonspecific hypodensity in the lower pole of the right kidney.  Diverticulosis of the sigmoid colon. No evidence of acute diverticulitis. Uterus and bladder are unremarkable. Pelvic varices are present. There is associated reflux in the left ovarian vein.  No destructive bone lesion. Focal sclerosis in the left iliac bone is likely related to arthritis. No acute fracture.  IMPRESSION: New 3.4 cm mass in the head of the  pancreas most consistent with adenocarcinoma. This extends to the SMA with some narrowing. There is also chronic occlusion of the portal vein.  Possible tiny gallstones. A recent ultrasound was negative. This finding is of unknown significance.  Pelvic varices secondary to venous insufficiency of the left ovarian vein.   Electronically Signed   By: Maryclare Bean M.D.   On: 11/30/2013 16:12      ASSESSMENT AND PLAN: Cancer of head of pancreas Mass appears to have arterial involvement in addition to occlusion of portal vein.   I am skeptical that she will come to resection, but I would like to discuss with radiology at our multidisciplinary conference.  We will also refer for EUS and biopsy.  This can allow [place] a better look at the vascular anatomy, in addition to getting a biopsy.   Her CA 19-9 is reasonably elevated as well (675)   I spoke with Dr. Tressie Stalker of oncology about her, and he is already set up to see her next week.   I reviewed briefly a port a cath with her, so if they discuss that, we can get that set up.   I will get chest CT to rule out thoracic metastatic disease.   I will send her for radiation oncology referral. I also will refer her for non-urgent genetics appt.  She has pancreatic cancer and had mother and sister with breast cancer.  Her brother had some type of intraabdominal cancer. I spent 45 min in evaluation, examination, counseling, and coordination of care.     Exocrine pancreatic insufficiency I will add Creon. I gave her a sample to make sure this works. I've advised her to take 2 pills with meals and one with snacks. If this is working we'll write her a prescription.      Milus Height MD Surgical Oncology, General and [facility]      Visit Diagnoses: 1. Cancer of head of pancreas   2. Exocrine pancreatic insufficiency     Primary Care Physician: Abigail Miyamoto, MD  GI - [place]

## 2014-01-11 NOTE — Discharge Instructions (Signed)
[facility] Office Phone Number [phone]   POST OP INSTRUCTIONS  Always review your discharge instruction sheet given to you by the facility where your surgery was performed.  IF YOU HAVE DISABILITY OR FAMILY LEAVE FORMS, YOU MUST BRING THEM TO THE OFFICE FOR PROCESSING.  DO NOT GIVE THEM TO YOUR DOCTOR.  1. A prescription for pain medication may be given to you upon discharge.  Take your pain medication as prescribed, if needed.  If narcotic pain medicine is not needed, then you may take acetaminophen (Tylenol) or ibuprofen (Advil) as needed. 2. Take your usually prescribed medications unless otherwise directed 3. If you need a refill on your pain medication, please contact your pharmacy.  They will contact our office to request authorization.  Prescriptions will not be filled after 5pm or on week-ends. 4. You should eat very light the first 24 hours after surgery, such as soup, crackers, pudding, etc.  Resume your normal diet the day after surgery 5. It is common to experience some constipation if taking pain medication after surgery.  Increasing fluid intake and taking a stool softener will usually help or prevent this problem from occurring.  A mild laxative (Milk of Magnesia or Miralax) should be taken according to package directions if there are no bowel movements after 48 hours. 6. You may shower in 48 hours.  The surgical glue will flake off in 2-3 weeks.   7. ACTIVITIES:  No strenuous activity or heavy lifting for 1 week.   a. You may drive when you no longer are taking prescription pain medication, you can comfortably wear a seatbelt, and you can safely maneuver your car and apply brakes. b. RETURN TO WORK:  __________n/a_______________ You should see your doctor in the office for a follow-up appointment approximately three-four weeks after your surgery.    WHEN TO CALL YOUR DOCTOR: 1. Fever over 101.0 2. Nausea and/or vomiting. 3. Extreme swelling or  bruising. 4. Continued bleeding from incision. 5. Increased pain, redness, or drainage from the incision.  The clinic staff is available to answer your questions during regular business hours.  Please don't hesitate to call and ask to speak to one of the nurses for clinical concerns.  If you have a medical emergency, go to the nearest emergency room or call 911.  A surgeon from [facility] is always on call at the hospital.  For further questions, please visit [email or web address]      

## 2014-01-11 NOTE — Progress Notes (Signed)
On hold for phase 2

## 2014-01-11 NOTE — Op Note (Signed)
PREOPERATIVE DIAGNOSIS:  Pancreatic cancer, unresectable     POSTOPERATIVE DIAGNOSIS:  Same     PROCEDURE: Left subclavian port placement, Bard CleaVue  Power Port, MRI safe, 8-French.      SURGEON:  Stark Klein, MD      ANESTHESIA:  General   FINDINGS:  Good venous return, easy flush, and tip of the catheter and   SVC 18 cm.      SPECIMEN:  None.      ESTIMATED BLOOD LOSS:  Minimal.      COMPLICATIONS:  None known.      PROCEDURE:  Pt was identified in the holding area and taken to   the operating room, where patient was placed supine on the operating room   table.  General anesthesia was induced.  Patient's arms were tucked and the upper   chest and neck were prepped and draped in sterile fashion.  Time-out was   performed according to the surgical safety check list.  When all was   correct, we continued.   Local anesthetic was administered over this   area at the angle of the clavicle.  The vein was accessed with 1 pass of the needle. There was good venous return and the wire passed easily with no ectopy.   Fluoroscopy was used to confirm that the wire was in the vena cava.      The patient was placed back level and the area for the pocket was anethetized   with local anesthetic.  A 3-cm transverse incision was made with a #15   blade.  Cautery was used to divide the subcutaneous tissues down to the   pectoralis muscle.  An [employer] retractor was used to elevate the skin   while a pocket was created on top of the pectoralis fascia.  The port   was placed into the pocket to confirm that it was of adequate size.  The   catheter was preattached to the port.  The port was then secured to the   pectoralis fascia with four 2-0 Prolene sutures.  These were clamped and   not tied down yet.    The catheter was tunneled through to the wire exit   site.  The catheter was placed along the wire to determine what length it should be to be in the SVC.  The catheter was cut at 18 cm.   The tunneler sheath and dilator were passed over the wire and the dilator and wire were removed.  The catheter was advanced through the tunneler sheath and the tunneler sheath was pulled away.  Care was taken to keep the catheter in the tunneler sheath as this occurred. This was advanced and the tunneler sheath was removed.  There was good venous   return and easy flush of the catheter.  The Prolene sutures were tied   down to the pectoral fascia.  The skin was reapproximated using 3-0   Vicryl interrupted deep dermal sutures.    Fluoroscopy was used to re-confirm good position of the catheter.  The skin   was then closed using 4-0 Monocryl in a subcuticular fashion.  The port was flushed with concentrated heparin flush as well.  The wounds were then cleaned, dried, and dressed with Dermabond.  The patient was awakened from anesthesia and taken to the PACU in stable condition.  Needle, sponge, and instrument counts were correct.               Stark Klein, MD

## 2014-01-11 NOTE — Interval H&P Note (Signed)
History and Physical Interval Note:  01/11/2014 7:25 AM  Shelley Graves  has presented today for surgery, with the diagnosis of pancreatic cancer  The various methods of treatment have been discussed with the patient and family. After consideration of risks, benefits and other options for treatment, the patient has consented to  Procedure(s): INSERTION PORT-A-CATH (N/A) as a surgical intervention .  The patient's history has been reviewed, patient examined, no change in status, stable for surgery.  I have reviewed the patient's chart and labs.  Questions were answered to the patient's satisfaction.     Roc Streett

## 2014-01-11 NOTE — Preoperative (Signed)
Beta Blockers   Reason not to administer Beta Blockers:Not Applicable 

## 2014-01-11 NOTE — Transfer of Care (Signed)
Immediate Anesthesia Transfer of Care Note  Patient: Shelley Graves  Procedure(s) Performed: Procedure(s): INSERTION PORT-A-CATH (N/A)  Patient Location: PACU  Anesthesia Type:General  Level of Consciousness: awake, alert , oriented and patient cooperative  Airway & Oxygen Therapy: Patient Spontanous Breathing and Patient connected to nasal cannula oxygen  Post-op Assessment: Report given to PACU RN, Post -op Vital signs reviewed and stable and Patient moving all extremities  Post vital signs: Reviewed and stable  Complications: No apparent anesthesia complications

## 2014-01-11 NOTE — Anesthesia Preprocedure Evaluation (Signed)
Anesthesia Evaluation  Patient identified by MRN, date of birth, ID band Patient awake    Reviewed: Allergy & Precautions, H&P , NPO status , Patient's Chart, lab work & pertinent test results  History of Anesthesia Complications Negative for: history of anesthetic complications  Airway Mallampati: III TM Distance: >3 FB Neck ROM: Full    Dental  (+) Upper Dentures, Lower Dentures   Pulmonary neg pulmonary ROS,  breath sounds clear to auscultation        Cardiovascular hypertension, Pt. on medications - angina- CAD, - CHF and - DOE Rhythm:Regular Rate:Normal     Neuro/Psych negative neurological ROS  negative psych ROS   GI/Hepatic Neg liver ROS, GERD-  Medicated and Controlled,  Endo/Other  diabetes, Type 2  Renal/GU negative Renal ROS     Musculoskeletal  (+) Arthritis -, Osteoarthritis,    Abdominal   Peds  Hematology Pancreatic cancer   Anesthesia Other Findings   Reproductive/Obstetrics                           Anesthesia Physical Anesthesia Plan  ASA: III  Anesthesia Plan: General   Post-op Pain Management:    Induction: Intravenous  Airway Management Planned: LMA  Additional Equipment: None  Intra-op Plan:   Post-operative Plan: Extubation in OR  Informed Consent: I have reviewed the patients History and Physical, chart, labs and discussed the procedure including the risks, benefits and alternatives for the proposed anesthesia with the patient or authorized representative who has indicated his/her understanding and acceptance.   Dental advisory given  Plan Discussed with: CRNA and Surgeon  Anesthesia Plan Comments:         Anesthesia Quick Evaluation

## 2014-01-11 NOTE — Anesthesia Procedure Notes (Signed)
Procedure Name: LMA Insertion Date/Time: 01/11/2014 7:40 AM Performed by: Julian Reil Pre-anesthesia Checklist: Patient identified, Emergency Drugs available, Suction available and Patient being monitored Patient Re-evaluated:Patient Re-evaluated prior to inductionOxygen Delivery Method: Circle system utilized Preoxygenation: Pre-oxygenation with 100% oxygen Intubation Type: IV induction LMA: LMA inserted LMA Size: 3.0 Tube type: Oral Number of attempts: 1 Placement Confirmation: positive ETCO2 and breath sounds checked- equal and bilateral Tube secured with: Tape Dental Injury: Teeth and Oropharynx as per pre-operative assessment

## 2014-01-14 NOTE — Addendum Note (Signed)
Encounter addended by: Adolph Pollack on: 01/14/2014  1:54 PM<BR>     Documentation filed: Notes Section

## 2014-01-14 NOTE — Progress Notes (Signed)
Accessed chart for case feedback information

## 2014-01-15 ENCOUNTER — Encounter (HOSPITAL_COMMUNITY): Payer: Self-pay | Admitting: General Surgery

## 2014-01-18 ENCOUNTER — Telehealth: Payer: Self-pay | Admitting: Genetic Counselor

## 2014-01-18 NOTE — Telephone Encounter (Signed)
Called patient to see if she could come in next week for genetic counseling.  She stated that she wanted to cancel her genetics appointment.  "i want to cancel everything, altogether".  I confirmed that she wanted to cancel the appointment in April and not r/s for next week.  She said yes.

## 2014-02-28 ENCOUNTER — Other Ambulatory Visit: Payer: Medicare Other

## 2014-02-28 ENCOUNTER — Encounter: Payer: Medicare Other | Admitting: Genetic Counselor

## 2014-10-31 IMAGING — CR DG CHEST 2V
2 series · 2 of 2 positions shown · non-contrast
Comparison: CT chest 12/20/2011.

CLINICAL DATA: Patient for Port-A-Cath insertion.

EXAM:
CHEST  2 VIEW

[w chest pa]
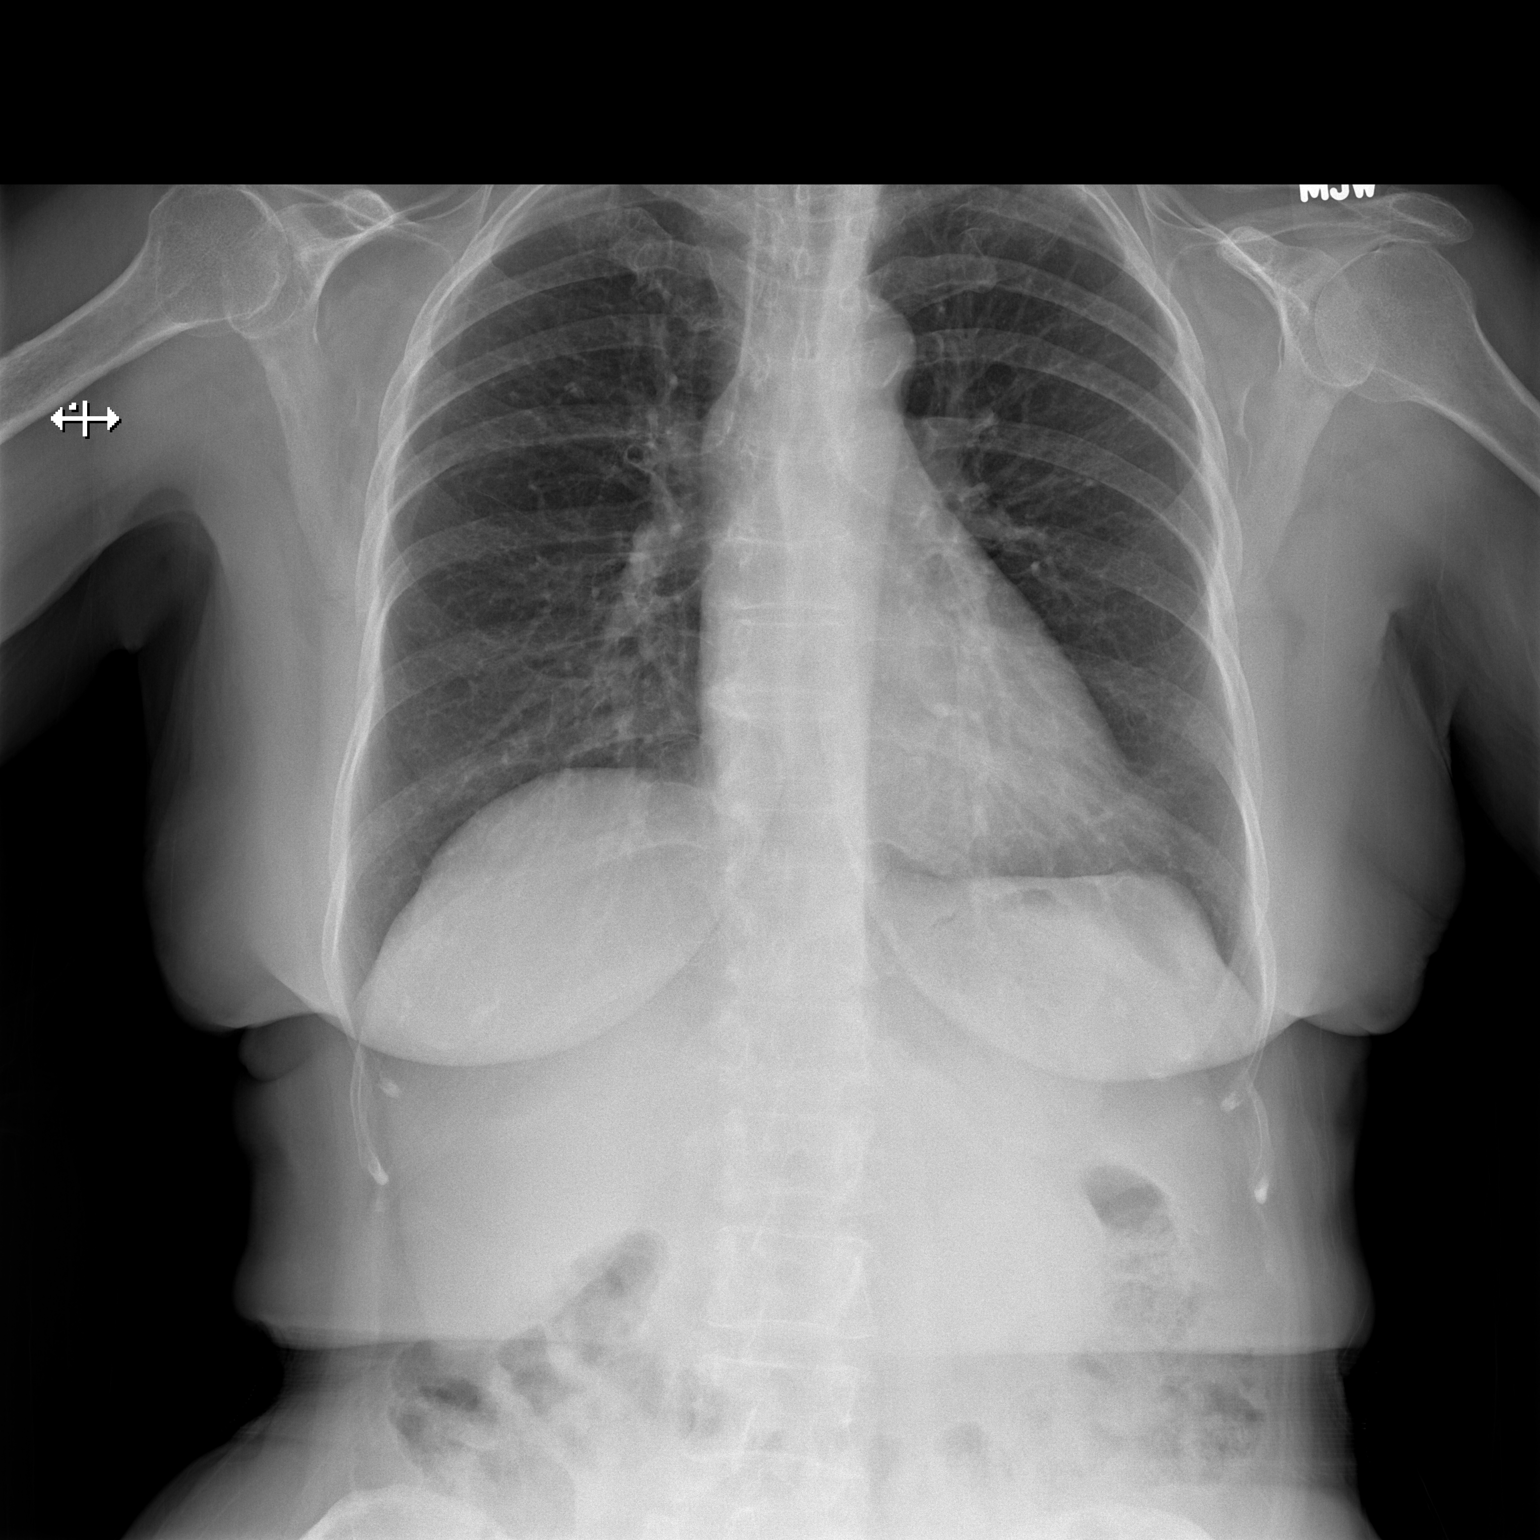

[w chest lat]
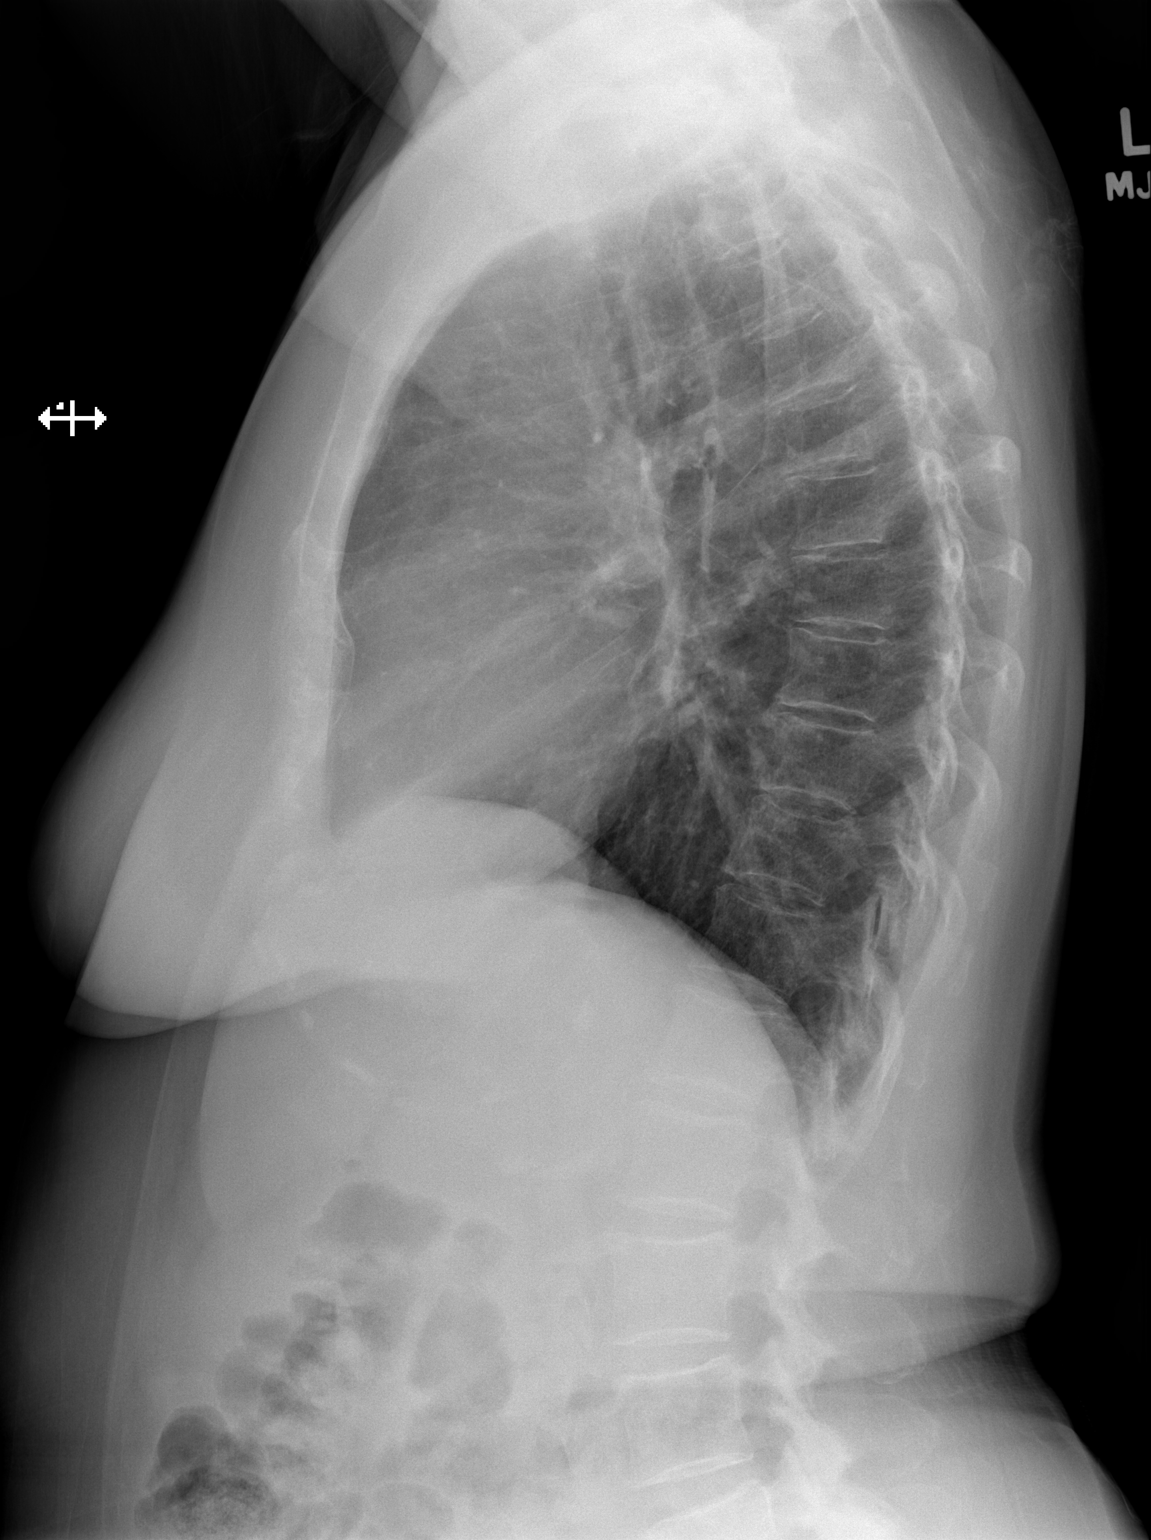

[2 of 2 positions shown; findings below may reference images not displayed]

FINDINGS: Lungs are clear. Heart size is normal. No pneumothorax or pleural
fluid. No focal bony abnormality.
IMPRESSION: Negative chest.

## 2014-11-01 IMAGING — CR DG CHEST 1V PORT
2 series · 2 of 2 positions shown · non-contrast
Comparison: DG CHEST 2 VIEW dated 01/10/2014

CLINICAL DATA: Port-A-Cath placement. Postoperative chest
radiograph.

EXAM:
PORTABLE CHEST - 1 VIEW

[AP (1 of 2)]
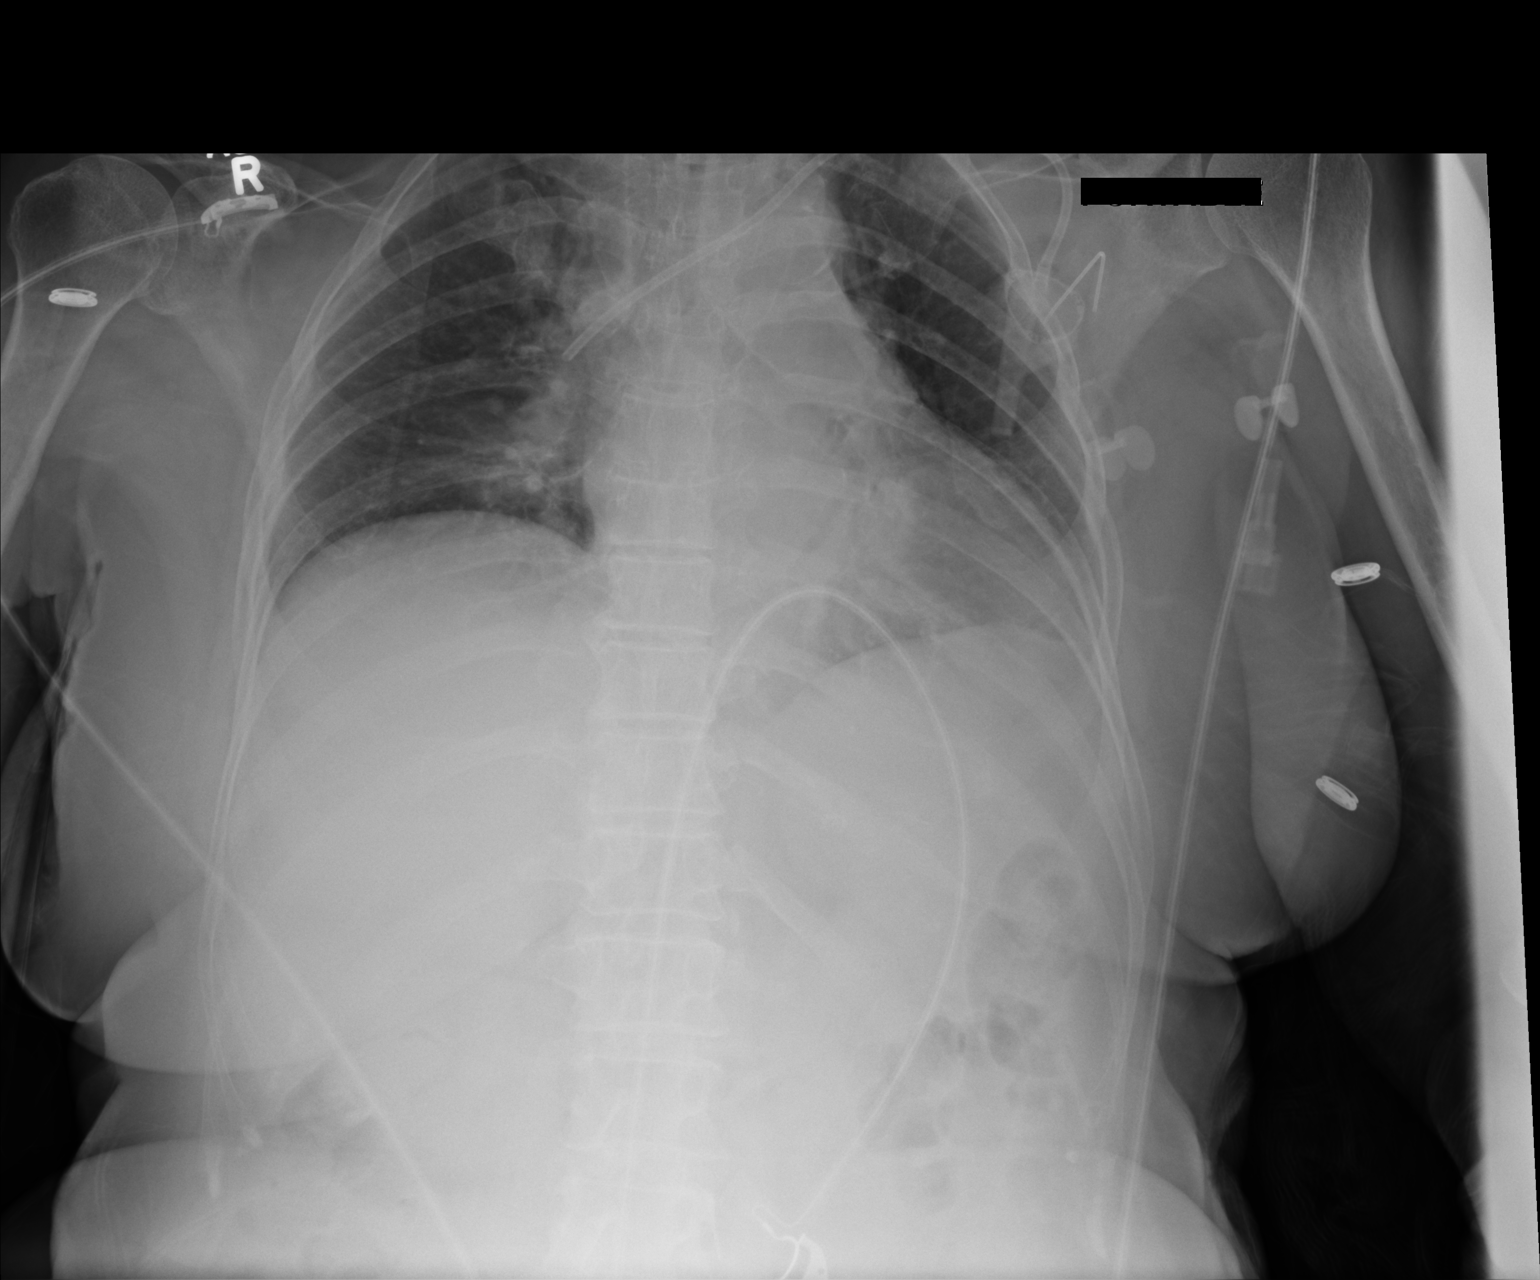

[AP (2 of 2)]
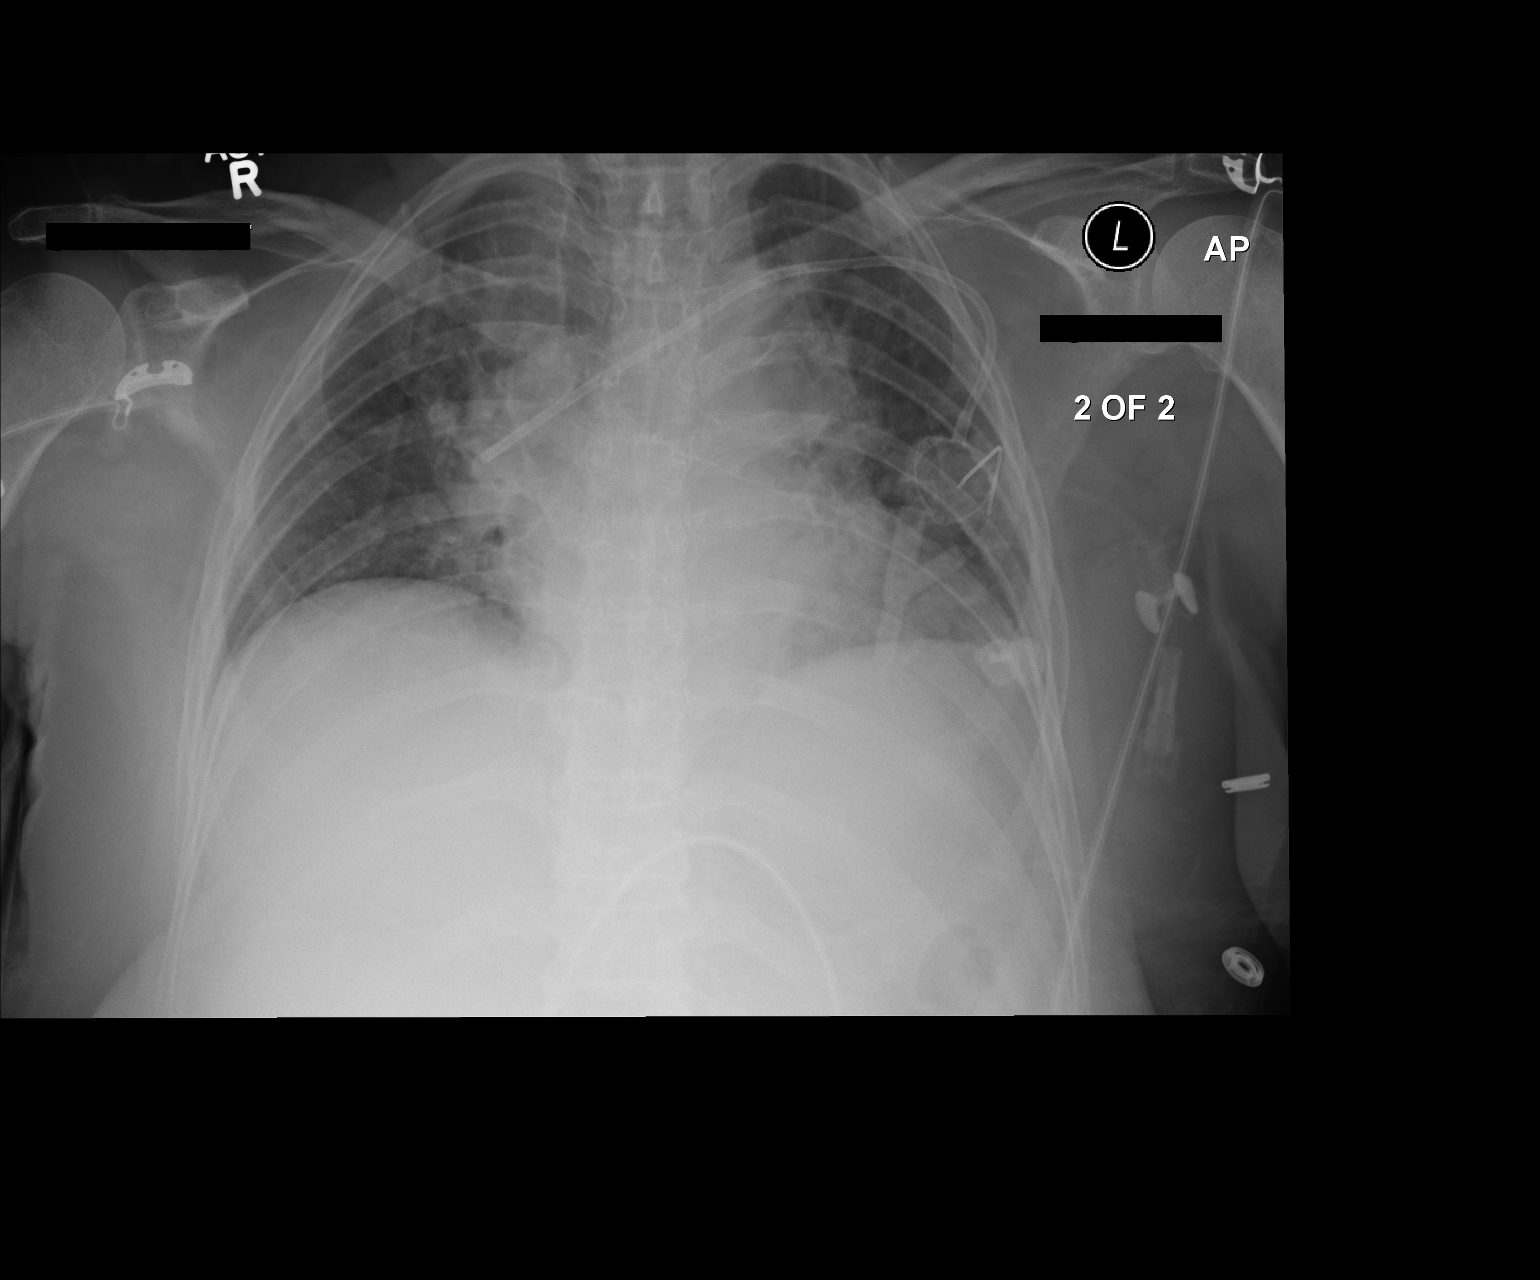

[2 of 2 positions shown; findings below may reference images not displayed]

FINDINGS: Low volume chest, with expiratory phase radiograph. There is no
pneumothorax. There is a new left subclavian power port with the tip
in the mid SVC at the level of the carina on this expiratory phase
film. Monitoring leads project over the chest. Cardiopericardial
silhouette and mediastinal contours are within normal limits
allowing for low volumes.
IMPRESSION: New left subclavian power port. No pneumothorax or complicating
features.

## 2014-11-05 ENCOUNTER — Ambulatory Visit: Payer: Self-pay | Admitting: Nurse Practitioner

## 2015-03-03 ENCOUNTER — Other Ambulatory Visit (HOSPITAL_COMMUNITY): Payer: Self-pay | Admitting: Oncology

## 2015-03-03 DIAGNOSIS — C259 Malignant neoplasm of pancreas, unspecified: Secondary | ICD-10-CM

## 2015-03-11 ENCOUNTER — Ambulatory Visit (HOSPITAL_COMMUNITY)
Admission: RE | Admit: 2015-03-11 | Discharge: 2015-03-11 | Disposition: A | Discharge status: Home / Self Care | Payer: Medicare Other | Source: Ambulatory Visit | Attending: Oncology | Admitting: Oncology

## 2015-03-11 DIAGNOSIS — Z08 Encounter for follow-up examination after completed treatment for malignant neoplasm: Secondary | ICD-10-CM | POA: Insufficient documentation

## 2015-03-11 DIAGNOSIS — C259 Malignant neoplasm of pancreas, unspecified: Secondary | ICD-10-CM | POA: Insufficient documentation

## 2015-03-11 LAB — GLUCOSE, CAPILLARY: GLUCOSE-CAPILLARY: 71 mg/dL (ref 70–99)

## 2015-03-11 MED ORDER — FLUDEOXYGLUCOSE F - 18 (FDG) INJECTION
5.1900 | Freq: Once | INTRAVENOUS | Status: AC | PRN
  Administered 2015-03-11: 5.19 via INTRAVENOUS

## 2015-03-18 ENCOUNTER — Other Ambulatory Visit: Payer: Self-pay | Admitting: Oncology

## 2015-03-18 DIAGNOSIS — R52 Pain, unspecified: Secondary | ICD-10-CM

## 2015-03-18 DIAGNOSIS — C257 Malignant neoplasm of other parts of pancreas: Secondary | ICD-10-CM

## 2015-04-03 ENCOUNTER — Ambulatory Visit
Admission: RE | Admit: 2015-04-03 | Discharge: 2015-04-03 | Disposition: A | Discharge status: Home / Self Care | Payer: Medicare Other | Source: Ambulatory Visit | Attending: Oncology | Admitting: Oncology

## 2015-04-03 ENCOUNTER — Other Ambulatory Visit: Payer: Self-pay | Admitting: Interventional Radiology

## 2015-04-03 ENCOUNTER — Other Ambulatory Visit: Payer: Medicare Other

## 2015-04-03 DIAGNOSIS — R52 Pain, unspecified: Secondary | ICD-10-CM

## 2015-04-03 DIAGNOSIS — C259 Malignant neoplasm of pancreas, unspecified: Secondary | ICD-10-CM

## 2015-04-03 DIAGNOSIS — C257 Malignant neoplasm of other parts of pancreas: Secondary | ICD-10-CM

## 2015-04-03 NOTE — Consult Note (Signed)
Chief Complaint: Chief Complaint  Patient presents with  . Advice Only    Consult for Celiac Plexus Block      Referring Physician(s):  Neijstrom,Eric  History of Present Illness: Shelley Graves is a 80 y.o. female who had been having back pain for around a year in her upper back. She states that she had low back pain for many years but that this is definitely different. She thought it was going into her ribs. She also started having weight loss. She has lost around 20 pounds because she gets nausea when she eats. She went to see primary care who referred her to orthopedics. She had evaluation of her spine, and she was told she had arthritis. She feels bloated and gassy all the time. She is also having significant diarrhea. She describes diarrhea as foul-smelling and floating in the toilet. Because of her back pain and nausea, she underwent ultrasound.  Ultrasound demonstrated concern for a pancreatic mass. CT scan confirmed this. She has been a diabetic for around 10 years. She does not think that her blood sugars have changed significantly. The lesion was thought  unresectable. She underwent radiation therapy and chemotherapy . She did well until several weeks ago, describing pain from her back to the lower sternal region and upper abdomen. Tumor markers  increased. PET/CT shows residual pancreatic mass, no distal metastatic disease. She is on a fentanyl patch and OxyContin. She rates her residual pain 5-10 out of 10 on the visual analog pain scale. She had significant mental/psychiatric difficulties with higher doses of narcotic pain medication.  Past Medical History  Diagnosis Date  . Hypertension   . Pancreatic mass   . Diabetes mellitus without complication     Type 2 NIDDM x 10 years  . Cancer     pancreatic  . Hx of therapeutic radiation   . Diarrhea     since radiation therapy  . Nausea   . Back pain     Past Surgical History  Procedure Laterality Date  .  Appendectomy  1956  . Dental surgery    . Humerus fracture surgery w/ implant  1956  . Tonsillectomy  age 41  . Eye surgery Bilateral 8 yrs ago    cataract surgery  . Eus N/A 12/26/2013    Procedure: ESOPHAGEAL ENDOSCOPIC ULTRASOUND (EUS) RADIAL;  Surgeon: Arta Silence, MD;  Location: [facility];  Service: Endoscopy;  Laterality: N/A;  . Portacath placement N/A 01/11/2014    Procedure: INSERTION PORT-A-CATH;  Surgeon: Stark Klein, MD;  Location: [facility];  Service: General;  Laterality: N/A;    Allergies: Codeine; Hydrocodone; Lisinopril; and Simvastatin  Medications: Prior to Admission medications   Medication Sig Start Date End Date Taking? Authorizing Provider  esomeprazole (NEXIUM) 20 MG capsule Take 20 mg by mouth daily at 12 noon.   Yes Historical Provider, MD  fentaNYL (DURAGESIC - DOSED MCG/HR) 75 MCG/HR Place 75 mcg onto the skin every 3 (three) days.   Yes Historical Provider, MD  gabapentin (NEURONTIN) 300 MG capsule Take 300 mg by mouth 3 (three) times daily. Take 600 mg in morning and at night.  Take 300 mg mid day.   Yes Historical Provider, MD  lipase/protease/amylase (CREON) 12000 UNITS CPEP capsule Take 24,000 Units by mouth 3 (three) times daily before meals.   Yes Historical Provider, MD  losartan (COZAAR) 50 MG tablet Take 50 mg by mouth 2 (two) times daily.  11/09/13  Yes Historical Provider, MD  lovastatin (  MEVACOR) 20 MG tablet Take 20 mg by mouth at bedtime.   Yes Historical Provider, MD  metFORMIN (GLUCOPHAGE-XR) 750 MG 24 hr tablet Take 1,500 mg by mouth every evening.    Yes Historical Provider, MD  ondansetron (ZOFRAN) 4 MG tablet Take 4 mg by mouth every 8 (eight) hours as needed for nausea.  11/30/13  Yes Historical Provider, MD  oxyCODONE-acetaminophen (PERCOCET/ROXICET) 5-325 MG per tablet Take 1 tablet by mouth every 4 (four) hours as needed for severe pain. 01/11/14  Yes Stark Klein, MD  pioglitazone (ACTOS) 15 MG tablet Take 15 mg by mouth every morning.   12/09/13  Yes Historical Provider, MD  potassium chloride (K-DUR) 10 MEQ tablet Take 10 mEq by mouth daily.   Yes Historical Provider, MD  verapamil (CALAN) 80 MG tablet Take 80 mg by mouth 2 (two) times daily.  11/22/13  Yes Historical Provider, MD  [place] test strip  12/07/13   Historical Provider, MD  rOPINIRole (REQUIP) 0.5 MG tablet Take 0.5 mg by mouth at bedtime.  11/08/13   Historical Provider, MD  sodium-potassium bicarbonate (ALKA-SELTZER GOLD) TBEF dissolvable tablet Take 1 tablet by mouth daily as needed. Upset stomach    Historical Provider, MD  traMADol-acetaminophen (ULTRACET) 37.5-325 MG per tablet Take 1 tablet by mouth every 6 (six) hours as needed for moderate pain.  12/09/13   Historical Provider, MD     Family History  Problem Relation Age of Onset  . Cancer Mother     breast mets  . Cancer Sister     breast mets to liver   . Diabetes Sister   . Cancer Brother   . Diabetes Brother   . Diabetes Brother     History   Social History  . Marital Status: Married    Spouse Name: N/A  . Number of Children: N/A  . Years of Education: N/A   Social History Main Topics  . Smoking status: Never Smoker   . Smokeless tobacco: Never Used  . Alcohol Use: No  . Drug Use: No  . Sexual Activity: No   Other Topics Concern  . Not on file   Social History Narrative  . No narrative on file    ECOG Status: 2 - Symptomatic, <50% confined to bed  Review of Systems: A 12 point ROS discussed and pertinent positives are indicated in the HPI above.  All other systems are negative.  Review of Systems  Vital Signs: BP 122/63 mmHg  Pulse 70  Temp(Src) 97.8 F (36.6 C) (Oral)  Resp 14  Ht 4\' 8"  (1.422 m)  Wt 104 lb (47.174 kg)  BMI 23.33 kg/m2  SpO2 99%  Physical Exam  Mallampati Score:     Imaging: Nm Pet Image Restag (ps) Skull Base To Thigh  03/11/2015   CLINICAL DATA:  Subsequent treatment strategy for pancreatic cancer.  EXAM: NUCLEAR MEDICINE PET  SKULL BASE TO THIGH  TECHNIQUE: 5.19 mCi F-18 FDG was injected intravenously. Full-ring PET imaging was performed from the skull base to thigh after the radiotracer. CT data was obtained and used for attenuation correction and anatomic localization.  FASTING BLOOD GLUCOSE:  Value: 71 mg/dl  COMPARISON:  Abdominal pelvic CT 12/20/2014 and 09/06/2014  FINDINGS: NECK  No hypermetabolic cervical lymph nodes are identified.There are no lesions of the pharyngeal mucosal space.  CHEST  There are no hypermetabolic mediastinal, hilar or axillary lymph nodes. There is no abnormal pulmonary metabolic activity. The previously demonstrated spiculated left lower lobe nodule  has resolved. There are no suspicious nodules. There is prominent activity within the port of the left subclavian Port-A-Cath. Mild aortic and great vessel atherosclerosis and a small hiatal hernia noted.  ABDOMEN/PELVIS  Known residual pancreatic head/ neck mass is suboptimally evaluated without oral or intravenous contrast, although is grossly unchanged from the most recent CT, measuring approximately 2.6 cm on image 98. There is low-level hypermetabolic activity in this area SUV max 4.1). Atrophy throughout the pancreatic body and tail is stable. Apart from this central hypermetabolic activity within the pancreas, and no surrounding hypermetabolic nodal activity is identified. There is no abnormal metabolic activity within the liver, spleen or adrenal glands. Colonic diverticulosis and multiple collateral vessels are grossly stable. There is a small amount of free pelvic fluid.  SKELETON  There is no hypermetabolic activity to suggest osseous metastatic disease.  IMPRESSION: 1. Low-level metabolic activity within the residual pancreatic head/neck mass, likely treated tumor based on stability. Residual viable tumor not excluded given the residual metabolic activity. Continued follow up recommended. 2. No distant metastases identified. 3. 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   Electronically Signed   By: Richardean Sale M.D.   On: 03/11/2015 12:32    Labs:  CBC: No results for input(s): WBC, HGB, HCT, PLT in the last 8760 hours.  COAGS: No results for input(s): INR, APTT in the last 8760 hours.  BMP: No results for input(s): NA, K, CL, CO2, GLUCOSE, BUN, CALCIUM, CREATININE, GFRNONAA, GFRAA in the last 8760 hours.  Invalid input(s): CMP  LIVER FUNCTION TESTS: No results for input(s): BILITOT, AST, ALT, ALKPHOS, PROT, ALBUMIN in the last 8760 hours.  TUMOR MARKERS: No results for input(s): AFPTM, CEA, CA199, CHROMGRNA in the last 8760 hours.  Assessment and Plan:  My impression is that patient's pain is on the certainly related to her residual disease in the pancreatic bed. She presents with classic's type symptoms. Given that she has really reached her limit of what she can tolerate as far as narcotic pain medication regimen, and her pain still gets up to 10 out of 10, is appropriate to consider alternative treatments. She would anatomically be an appropriate candidate for celiac plexus block and neural lysis. This is a high likelihood of significant pain relief. We discussed the technique, anticipated benefits, possible risks and side effects, time course of symptom resolution, and likelihood of success. She had her daughter seemed to understand and did ask appropriate questions. She is motivated to proceed. Therefore, we can set this up as an outpatient at [facility] at her convenience.  Thank you for this interesting consult.  I greatly enjoyed meeting TIGERLILY CHRISTINE and look forward to participating in their care.  Signed: Drystan Reader III, DAYNE Antasia Haider 04/03/2015, 1:14 PM   I spent a total of  30 Minutes   in face to face in clinical consultation, greater than 50% of which was counseling/coordinating care for abdominal pain probably secondary to pancreatic  carcinoma.

## 2015-04-09 ENCOUNTER — Other Ambulatory Visit: Payer: Self-pay | Admitting: Physician Assistant

## 2015-04-10 ENCOUNTER — Encounter (HOSPITAL_COMMUNITY): Payer: Self-pay

## 2015-04-10 ENCOUNTER — Other Ambulatory Visit: Payer: Self-pay | Admitting: Interventional Radiology

## 2015-04-10 ENCOUNTER — Ambulatory Visit (HOSPITAL_COMMUNITY)
Admission: RE | Admit: 2015-04-10 | Discharge: 2015-04-10 | Disposition: A | Discharge status: Home / Self Care | Payer: Medicare Other | Source: Ambulatory Visit | Attending: Interventional Radiology | Admitting: Interventional Radiology

## 2015-04-10 DIAGNOSIS — R1013 Epigastric pain: Secondary | ICD-10-CM

## 2015-04-10 DIAGNOSIS — C259 Malignant neoplasm of pancreas, unspecified: Secondary | ICD-10-CM | POA: Insufficient documentation

## 2015-04-10 DIAGNOSIS — G8929 Other chronic pain: Secondary | ICD-10-CM | POA: Insufficient documentation

## 2015-04-10 LAB — CBC
HEMATOCRIT: 34.8 % — AB (ref 36.0–46.0)
Hemoglobin: 11.3 g/dL — ABNORMAL LOW (ref 12.0–15.0)
MCH: 27.6 pg (ref 26.0–34.0)
MCHC: 32.5 g/dL (ref 30.0–36.0)
MCV: 84.9 fL (ref 78.0–100.0)
Platelets: 129 10*3/uL — ABNORMAL LOW (ref 150–400)
RBC: 4.1 MIL/uL (ref 3.87–5.11)
RDW: 13.6 % (ref 11.5–15.5)
WBC: 4.8 10*3/uL (ref 4.0–10.5)

## 2015-04-10 LAB — GLUCOSE, CAPILLARY: Glucose-Capillary: 75 mg/dL (ref 65–99)

## 2015-04-10 LAB — APTT: aPTT: 27 seconds (ref 24–37)

## 2015-04-10 LAB — PROTIME-INR
INR: 0.99 (ref 0.00–1.49)
Prothrombin Time: 13.3 seconds (ref 11.6–15.2)

## 2015-04-10 MED ORDER — SODIUM CHLORIDE 0.9 % IV SOLN
INTRAVENOUS | Status: DC
  Administered 2015-04-10: 12:00:00 via INTRAVENOUS

## 2015-04-10 MED ORDER — HYDROMORPHONE HCL 2 MG/ML IJ SOLN
0.5000 mg | Freq: Once | INTRAMUSCULAR | Status: AC
  Administered 2015-04-10: 0.5 mg via INTRAVENOUS

## 2015-04-10 MED ORDER — FENTANYL CITRATE (PF) 100 MCG/2ML IJ SOLN
INTRAMUSCULAR | Status: AC | PRN
  Administered 2015-04-10 (×4): 12.5 ug via INTRAVENOUS

## 2015-04-10 MED ORDER — BUPIVACAINE HCL (PF) 0.25 % IJ SOLN
INTRAMUSCULAR | Status: AC
  Filled 2015-04-10: qty 30

## 2015-04-10 MED ORDER — IOHEXOL 300 MG/ML  SOLN
50.0000 mL | Freq: Once | INTRAMUSCULAR | Status: AC | PRN
  Administered 2015-04-10: 50 mL

## 2015-04-10 MED ORDER — HYDROMORPHONE HCL 1 MG/ML IJ SOLN: 0.5000 mg | Freq: Once | INTRAMUSCULAR | Status: DC

## 2015-04-10 MED ORDER — MORPHINE SULFATE 2 MG/ML IJ SOLN: 2.0000 mg | INTRAMUSCULAR | Status: DC | PRN

## 2015-04-10 MED ORDER — FENTANYL CITRATE (PF) 100 MCG/2ML IJ SOLN
INTRAMUSCULAR | Status: AC
  Filled 2015-04-10: qty 4

## 2015-04-10 MED ORDER — MIDAZOLAM HCL 2 MG/2ML IJ SOLN
INTRAMUSCULAR | Status: DC
Start: 2015-04-10 — End: 2015-04-11
  Filled 2015-04-10: qty 6

## 2015-04-10 MED ORDER — HYDROCODONE-ACETAMINOPHEN 5-325 MG PO TABS
1.0000 | ORAL_TABLET | ORAL | Status: DC | PRN
  Filled 2015-04-10: qty 2

## 2015-04-10 MED ORDER — DEXTROSE-NACL 5-0.45 % IV SOLN
INTRAVENOUS | Status: DC
  Administered 2015-04-10: 12:00:00 via INTRAVENOUS

## 2015-04-10 MED ORDER — HYDROMORPHONE HCL 2 MG/ML IJ SOLN
0.5000 mg | INTRAMUSCULAR | Status: AC
  Administered 2015-04-10: 0.5 mg via INTRAVENOUS

## 2015-04-10 MED ORDER — HYDROMORPHONE HCL 2 MG/ML IJ SOLN
INTRAMUSCULAR | Status: AC
  Administered 2015-04-10: 0.5 mg via INTRAVENOUS
  Filled 2015-04-10: qty 1

## 2015-04-10 MED ORDER — MIDAZOLAM HCL 2 MG/2ML IJ SOLN
INTRAMUSCULAR | Status: AC | PRN
  Administered 2015-04-10 (×2): 0.5 mg via INTRAVENOUS

## 2015-04-10 MED ORDER — HYDROMORPHONE HCL 2 MG/ML IJ SOLN
INTRAMUSCULAR | Status: AC
  Filled 2015-04-10: qty 1

## 2015-04-10 MED FILL — Alcohol Absolute Inj 98%: INTRAVENOUS | Qty: 7 | Status: AC

## 2015-04-10 NOTE — Discharge Instructions (Signed)
Conscious Sedation °Sedation is the use of medicines to promote relaxation and relieve discomfort and anxiety. Conscious sedation is a type of sedation. Under conscious sedation you are less alert than normal but are still able to respond to instructions or stimulation. Conscious sedation is used during short medical and dental procedures. It is milder than deep sedation or general anesthesia and allows you to return to your regular activities sooner.  °LET YOUR HEALTH CARE PROVIDER KNOW ABOUT:  °· Any allergies you have. °· All medicines you are taking, including vitamins, herbs, eye drops, creams, and over-the-counter medicines. °· Use of steroids (by mouth or creams). °· Previous problems you or members of your family have had with the use of anesthetics. °· Any blood disorders you have. °· Previous surgeries you have had. °· Medical conditions you have. °· Possibility of pregnancy, if this applies. °· Use of cigarettes, alcohol, or illegal drugs. °RISKS AND COMPLICATIONS °Generally, this is a safe procedure. However, as with any procedure, problems can occur. Possible problems include: °· Oversedation. °· Trouble breathing on your own. You may need to have a breathing tube until you are awake and breathing on your own. °· Allergic reaction to any of the medicines used for the procedure. °BEFORE THE PROCEDURE °· You may have blood tests done. These tests can help show how well your kidneys and liver are working. They can also show how well your blood clots. °· A physical exam will be done.   °· Only take medicines as directed by your health care provider. You may need to stop taking medicines (such as blood thinners, aspirin, or nonsteroidal anti-inflammatory drugs) before the procedure.   °· Do not eat or drink at least 6 hours before the procedure or as directed by your health care provider. °· Arrange for a responsible adult, family member, or friend to take you home after the procedure. He or she should stay  with you for at least 24 hours after the procedure, until the medicine has worn off. °PROCEDURE  °· An intravenous (IV) catheter will be inserted into one of your veins. Medicine will be able to flow directly into your body through this catheter. You may be given medicine through this tube to help prevent pain and help you relax. °· The medical or dental procedure will be done. °AFTER THE PROCEDURE °· You will stay in a recovery area until the medicine has worn off. Your blood pressure and pulse will be checked.   °·  Depending on the procedure you had, you may be allowed to go home when you can tolerate liquids and your pain is under control. °Document Released: 07/20/2001 Document Revised: 10/30/2013 Document Reviewed: 07/02/2013 °Exit[employer]® Patient Information ©2015 [employer]. This information is not intended to replace advice given to you by your health care provider. Make sure you discuss any questions you have with your health care provider. ° °

## 2015-04-10 NOTE — Progress Notes (Signed)
Patient ID: Shelley Graves, female   DOB: 03/01/1935, 80 y.o.   MRN: [id number]    Referring Physician(s): Neijstrom,E  Subjective: Pt recently seen in consultation by Dr. Vernard Gambles for consideration of celiac plexus block/neurolysis (see note from 04/03/15) . She has known pancreatic cancer with persistent abd /upper chest and back pain despite multiple narcotic drug regimen. She was deemed appropriate candidate for celiac plexus block to assist with pain management and presents today for the procedure under CT guidance. She denies recent fever, cough,dyspnea, N/V, or abnormal bleeding. She has occasional HA's.    Allergies: Codeine; Hydrocodone; Lisinopril; and Simvastatin  Medications: Prior to Admission medications   Medication Sig Start Date End Date Taking? Authorizing Provider  esomeprazole (NEXIUM) 20 MG capsule Take 20 mg by mouth daily at 12 noon.   Yes Historical Provider, MD  gabapentin (NEURONTIN) 300 MG capsule Take 300 mg by mouth 3 (three) times daily. Take 600 mg in morning and at night.  Take 300 mg mid day.   Yes Historical Provider, MD  losartan (COZAAR) 50 MG tablet Take 50 mg by mouth 2 (two) times daily.  11/09/13  Yes Historical Provider, MD  metFORMIN (GLUCOPHAGE-XR) 750 MG 24 hr tablet Take 1,500 mg by mouth every evening.    Yes Historical Provider, MD  ondansetron (ZOFRAN) 4 MG tablet Take 4 mg by mouth every 8 (eight) hours as needed for nausea.  11/30/13  Yes Historical Provider, MD  pioglitazone (ACTOS) 15 MG tablet Take 15 mg by mouth every morning.  12/09/13  Yes Historical Provider, MD  potassium chloride (K-DUR) 10 MEQ tablet Take 10 mEq by mouth daily.   Yes Historical Provider, MD  verapamil (CALAN) 80 MG tablet Take 80 mg by mouth 2 (two) times daily.  11/22/13  Yes Historical Provider, MD  [place] test strip  12/07/13   Historical Provider, MD  fentaNYL (DURAGESIC - DOSED MCG/HR) 75 MCG/HR Place 75 mcg onto the skin every 3 (three) days.     Historical Provider, MD  lipase/protease/amylase (CREON) 12000 UNITS CPEP capsule Take 24,000 Units by mouth 3 (three) times daily before meals.    Historical Provider, MD  lovastatin (MEVACOR) 20 MG tablet Take 20 mg by mouth at bedtime.    Historical Provider, MD  oxyCODONE-acetaminophen (PERCOCET/ROXICET) 5-325 MG per tablet Take 1 tablet by mouth every 4 (four) hours as needed for severe pain. 01/11/14   Stark Klein, MD  rOPINIRole (REQUIP) 0.5 MG tablet Take 0.5 mg by mouth at bedtime.  11/08/13   Historical Provider, MD  sodium-potassium bicarbonate (ALKA-SELTZER GOLD) TBEF dissolvable tablet Take 1 tablet by mouth daily as needed. Upset stomach    Historical Provider, MD  traMADol-acetaminophen (ULTRACET) 37.5-325 MG per tablet Take 1 tablet by mouth every 6 (six) hours as needed for moderate pain.  12/09/13   Historical Provider, MD     Vital Signs: There were no vitals taken for this visit.  Physical Exam pt awake/alert; chest- CTA bilat; intact, clean left chest wall PAC; heart- RRR; abd- soft,+BS,mild- mod tender epigastric region; ext- FROM, no edema  Imaging: No results found.  Labs:  CBC: No results for input(s): WBC, HGB, HCT, PLT in the last 8760 hours.  COAGS: No results for input(s): INR, APTT in the last 8760 hours.  BMP: No results for input(s): NA, K, CL, CO2, GLUCOSE, BUN, CALCIUM, CREATININE, GFRNONAA, GFRAA in the last 8760 hours.  Invalid input(s): CMP  LIVER FUNCTION TESTS: No results for input(s): BILITOT, AST,  ALT, ALKPHOS, PROT, ALBUMIN in the last 8760 hours.  Assessment and Plan:  Pt with hx pancreatic cancer and persistent abd/lower chest/back pain despite multidrug narcotic regimen. She presents today following recent IR consultation for CT guided celiac plexus block/neurolysis. Details/risks of procedure, including but not limited to, internal bleeding, infection, injury to adjacent organs, diarrhea, and inability to control abdominal pain d/w pt with  her understanding and consent. Pt's PAC flushes well but nurse unable to aspirate blood from site.   Signed: D. Rowe Robert 04/10/2015, 11:38 AM   I spent a total of 15 minutes in face to face in clinical consultation/evaluation, greater than 50% of which was counseling/coordinating care for CT guided celiac plexus block

## 2015-04-10 NOTE — Procedures (Signed)
CT celiac plexus block and neurolysis No complication No blood loss. See complete dictation in [facility].

## 2015-04-15 ENCOUNTER — Other Ambulatory Visit: Payer: Medicare Other

## 2015-04-16 ENCOUNTER — Telehealth: Payer: Self-pay | Admitting: Radiology

## 2015-04-16 NOTE — Telephone Encounter (Signed)
S/p:  Celiac plexus block, 04/10/2015  Patient reports that she continues to experience pain as prior to Celiac Plexus Block  Per Dr Vernard Gambles patient notified that it may require as much as two weeks before patient notices improvement in symptoms.    Will check w/ patient in 2-3 wks for status update.    Shelley Edgin Riki Rusk, RN 04/16/2015 2:02 PM

## 2015-04-29 ENCOUNTER — Telehealth: Payer: Self-pay | Admitting: Radiology

## 2015-04-29 ENCOUNTER — Other Ambulatory Visit (HOSPITAL_COMMUNITY): Payer: Self-pay | Admitting: Interventional Radiology

## 2015-04-29 DIAGNOSIS — C257 Malignant neoplasm of other parts of pancreas: Secondary | ICD-10-CM

## 2015-04-29 DIAGNOSIS — R52 Pain, unspecified: Secondary | ICD-10-CM

## 2015-04-29 NOTE — Telephone Encounter (Signed)
Patient states that she has not experienced any changes re: pain relief 1 mo s/p Celiac Plexus Block (04/03/2015).   1 mo follow up appointment scheduled for 05/06/2015 at 1:30 pm with Dr Vernard Gambles.  Shawnelle Spoerl Riki Rusk, RN 04/29/2015 12:26 PM

## 2015-05-06 ENCOUNTER — Other Ambulatory Visit: Payer: Self-pay | Admitting: Interventional Radiology

## 2015-05-06 ENCOUNTER — Ambulatory Visit
Admission: RE | Admit: 2015-05-06 | Discharge: 2015-05-06 | Disposition: A | Discharge status: Home / Self Care | Payer: Medicare Other | Source: Ambulatory Visit | Attending: Interventional Radiology | Admitting: Interventional Radiology

## 2015-05-06 DIAGNOSIS — R52 Pain, unspecified: Secondary | ICD-10-CM

## 2015-05-06 DIAGNOSIS — C257 Malignant neoplasm of other parts of pancreas: Secondary | ICD-10-CM

## 2015-05-06 DIAGNOSIS — C259 Malignant neoplasm of pancreas, unspecified: Secondary | ICD-10-CM

## 2015-05-06 NOTE — Consult Note (Signed)
Referring Physician(s): Everardo All  Subjective:  Patient returns for clinic follow-up 26 days post celiac plexus block and neurolysis under CT guidance. She Is accompanied by a daughter. She has nonoperable pancreatic carcinoma. On imaging, the procedure seemed technically excellent, although she had an abnormal pain response immediately after ethanol administration with fairly severe pain which we had some difficulty getting back under control. She remembers this. Afterwards, her presenting symptoms did not significantly improve, and she is disappointed about this as am I. She continues to use Advil 4 3 times a day for primary pain control, augmented by oxycodone when necessary, typically 1 per day. She also has a fentanyl 100 patch that's changed every 3 days. The oxycodone does not cause any significant side effects. At best, she rates her pain 0 out of 10. At worst, its in the 7-8 range. She would like to get her pain better under control.  Allergies: Codeine; Hydrocodone; Lisinopril; Simvastatin; and Morphine and related  Medications: Prior to Admission medications   Medication Sig Start Date End Date Taking? Authorizing Provider  ACCU-CHEK AVIVA PLUS test strip  12/07/13   Historical Provider, MD  esomeprazole (NEXIUM) 20 MG capsule Take 20 mg by mouth daily at 12 noon.    Historical Provider, MD  fentaNYL (DURAGESIC - DOSED MCG/HR) 75 MCG/HR Place 75 mcg onto the skin every 3 (three) days.    Historical Provider, MD  gabapentin (NEURONTIN) 300 MG capsule Take 300 mg by mouth 3 (three) times daily. Take 600 mg in morning and at night.  Take 300 mg mid day.    Historical Provider, MD  lipase/protease/amylase (CREON) 12000 UNITS CPEP capsule Take 24,000 Units by mouth 3 (three) times daily before meals.    Historical Provider, MD  losartan (COZAAR) 50 MG tablet Take 50 mg by mouth 2 (two) times daily.  11/09/13   Historical Provider, MD  lovastatin (MEVACOR) 20 MG tablet Take 20 mg  by mouth at bedtime.    Historical Provider, MD  metFORMIN (GLUCOPHAGE-XR) 750 MG 24 hr tablet Take 1,500 mg by mouth every evening.     Historical Provider, MD  ondansetron (ZOFRAN) 4 MG tablet Take 4 mg by mouth every 8 (eight) hours as needed for nausea.  11/30/13   Historical Provider, MD  oxyCODONE-acetaminophen (PERCOCET/ROXICET) 5-325 MG per tablet Take 1 tablet by mouth every 4 (four) hours as needed for severe pain. 01/11/14   Stark Klein, MD  pioglitazone (ACTOS) 15 MG tablet Take 15 mg by mouth every morning.  12/09/13   Historical Provider, MD  potassium chloride (K-DUR) 10 MEQ tablet Take 10 mEq by mouth daily.    Historical Provider, MD  rOPINIRole (REQUIP) 0.5 MG tablet Take 0.5 mg by mouth at bedtime.  11/08/13   Historical Provider, MD  sodium-potassium bicarbonate (ALKA-SELTZER GOLD) TBEF dissolvable tablet Take 1 tablet by mouth daily as needed. Upset stomach    Historical Provider, MD  traMADol-acetaminophen (ULTRACET) 37.5-325 MG per tablet Take 1 tablet by mouth every 6 (six) hours as needed for moderate pain.  12/09/13   Historical Provider, MD  verapamil (CALAN) 80 MG tablet Take 80 mg by mouth 2 (two) times daily.  11/22/13   Historical Provider, MD     Vital Signs: BP 135/57 mmHg  Pulse 65  Temp(Src) 98.2 F (36.8 C) (Oral)  Resp 15  SpO2 98%  Physical Exam  Constitutional: She appears well-developed and well-nourished. No distress.  HENT:  Head: Normocephalic and atraumatic.  Eyes: Conjunctivae  and EOM are normal. Right eye exhibits no discharge. Left eye exhibits no discharge. No scleral icterus.  Pulmonary/Chest: Effort normal. No respiratory distress.  Abdominal: Soft. She exhibits no distension.  Skin: She is not diaphoretic.  Psychiatric: She has a normal mood and affect. Her behavior is normal. Judgment and thought content normal.  Vitals reviewed.   Imaging: No results found.  Labs:  CBC:  Recent Labs  04/10/15 1200  WBC 4.8  HGB 11.3*  HCT 34.8*    PLT 129*    COAGS:  Recent Labs  04/10/15 1200  INR 0.99  APTT 27    BMP: No results for input(s): NA, K, CL, CO2, GLUCOSE, BUN, CALCIUM, CREATININE, GFRNONAA, GFRAA in the last 8760 hours.  Invalid input(s): CMP  LIVER FUNCTION TESTS: No results for input(s): BILITOT, AST, ALT, ALKPHOS, PROT, ALBUMIN in the last 8760 hours.  Assessment and Plan:  My impression is that she has not had significant pain relief after celiac plexus block and neuro  lysis. She is motivated to obtain better pain relief. I think initially she can easily increase her oxycodone dosing to twice a day or even more frequently as tolerated. I did advise her to stay on the scheduled Advil dosing and fentanyl patch as these are contributing to pain relief. I think it is reasonable to proceed with a repeat CT guided celiac plexus block and neurolysis. Her pain persists and her tumor persists. I would use a bilateral approach and make sure radiology nursing is prepared for another significant pain response, given her past history although these are not typical. She and her daughter did seem to understand and felt like  their questions were answered. She was motivated to proceed, therefore we can set this up at her convenience as an outpatient.  Signed: Fusaye Wachtel III, DAYNE Velvia Mehrer 05/06/2015, 3:42 PM   I spent a total of 35 Minutes in face to face in clinical consultation/evaluation, greater than 50% of which was counseling/coordinating care for cancer pain management primarily CT-guided celiac plexus block and neurolysis.

## 2015-05-08 ENCOUNTER — Other Ambulatory Visit: Payer: Self-pay | Admitting: General Surgery

## 2015-05-09 ENCOUNTER — Encounter (HOSPITAL_BASED_OUTPATIENT_CLINIC_OR_DEPARTMENT_OTHER): Payer: Self-pay | Admitting: *Deleted

## 2015-05-13 ENCOUNTER — Encounter (HOSPITAL_BASED_OUTPATIENT_CLINIC_OR_DEPARTMENT_OTHER): Payer: Self-pay | Admitting: *Deleted

## 2015-05-13 ENCOUNTER — Encounter (HOSPITAL_BASED_OUTPATIENT_CLINIC_OR_DEPARTMENT_OTHER)
Admission: RE | Admit: 2015-05-13 | Discharge: 2015-05-13 | Disposition: A | Discharge status: Home / Self Care | Payer: Medicare Other | Source: Ambulatory Visit | Attending: General Surgery | Admitting: General Surgery

## 2015-05-13 DIAGNOSIS — Y713 Surgical instruments, materials and cardiovascular devices (including sutures) associated with adverse incidents: Secondary | ICD-10-CM | POA: Diagnosis not present

## 2015-05-13 DIAGNOSIS — F329 Major depressive disorder, single episode, unspecified: Secondary | ICD-10-CM | POA: Diagnosis not present

## 2015-05-13 DIAGNOSIS — Z87891 Personal history of nicotine dependence: Secondary | ICD-10-CM | POA: Diagnosis not present

## 2015-05-13 DIAGNOSIS — Z79899 Other long term (current) drug therapy: Secondary | ICD-10-CM | POA: Diagnosis not present

## 2015-05-13 DIAGNOSIS — T82898A Other specified complication of vascular prosthetic devices, implants and grafts, initial encounter: Secondary | ICD-10-CM | POA: Diagnosis not present

## 2015-05-13 DIAGNOSIS — Z85828 Personal history of other malignant neoplasm of skin: Secondary | ICD-10-CM | POA: Diagnosis not present

## 2015-05-13 DIAGNOSIS — T82594A Other mechanical complication of infusion catheter, initial encounter: Secondary | ICD-10-CM | POA: Diagnosis not present

## 2015-05-13 DIAGNOSIS — G4730 Sleep apnea, unspecified: Secondary | ICD-10-CM | POA: Diagnosis not present

## 2015-05-13 DIAGNOSIS — Z86010 Personal history of colonic polyps: Secondary | ICD-10-CM | POA: Diagnosis not present

## 2015-05-13 DIAGNOSIS — G8929 Other chronic pain: Secondary | ICD-10-CM | POA: Diagnosis not present

## 2015-05-13 DIAGNOSIS — C259 Malignant neoplasm of pancreas, unspecified: Secondary | ICD-10-CM | POA: Diagnosis not present

## 2015-05-13 DIAGNOSIS — E039 Hypothyroidism, unspecified: Secondary | ICD-10-CM | POA: Diagnosis not present

## 2015-05-13 LAB — BASIC METABOLIC PANEL
Anion gap: 6 (ref 5–15)
BUN: 10 mg/dL (ref 6–20)
CO2: 28 mmol/L (ref 22–32)
Calcium: 8.9 mg/dL (ref 8.9–10.3)
Chloride: 101 mmol/L (ref 101–111)
Creatinine, Ser: 1.14 mg/dL — ABNORMAL HIGH (ref 0.44–1.00)
GFR, EST AFRICAN AMERICAN: 52 mL/min — AB (ref >60)
GFR, EST NON AFRICAN AMERICAN: 45 mL/min — AB (ref >60)
GLUCOSE: 120 mg/dL — AB (ref 65–99)
Potassium: 5 mmol/L (ref 3.5–5.1)
SODIUM: 135 mmol/L (ref 135–145)

## 2015-05-14 ENCOUNTER — Encounter (HOSPITAL_BASED_OUTPATIENT_CLINIC_OR_DEPARTMENT_OTHER): Payer: Self-pay | Admitting: Anesthesiology

## 2015-05-14 ENCOUNTER — Encounter (HOSPITAL_BASED_OUTPATIENT_CLINIC_OR_DEPARTMENT_OTHER)
Admission: RE | Disposition: A | Discharge status: Home / Self Care | Payer: Self-pay | Source: Ambulatory Visit | Attending: General Surgery

## 2015-05-14 ENCOUNTER — Ambulatory Visit (HOSPITAL_COMMUNITY): Payer: Medicare Other

## 2015-05-14 ENCOUNTER — Ambulatory Visit (HOSPITAL_BASED_OUTPATIENT_CLINIC_OR_DEPARTMENT_OTHER): Payer: Medicare Other | Admitting: Anesthesiology

## 2015-05-14 ENCOUNTER — Ambulatory Visit (HOSPITAL_BASED_OUTPATIENT_CLINIC_OR_DEPARTMENT_OTHER)
Admission: RE | Admit: 2015-05-14 | Discharge: 2015-05-14 | Disposition: A | Discharge status: Home / Self Care | Payer: Medicare Other | Source: Ambulatory Visit | Attending: General Surgery | Admitting: General Surgery

## 2015-05-14 DIAGNOSIS — Z86010 Personal history of colonic polyps: Secondary | ICD-10-CM | POA: Insufficient documentation

## 2015-05-14 DIAGNOSIS — T82898A Other specified complication of vascular prosthetic devices, implants and grafts, initial encounter: Secondary | ICD-10-CM | POA: Diagnosis not present

## 2015-05-14 DIAGNOSIS — G8929 Other chronic pain: Secondary | ICD-10-CM | POA: Insufficient documentation

## 2015-05-14 DIAGNOSIS — Z85828 Personal history of other malignant neoplasm of skin: Secondary | ICD-10-CM | POA: Insufficient documentation

## 2015-05-14 DIAGNOSIS — Z95828 Presence of other vascular implants and grafts: Secondary | ICD-10-CM

## 2015-05-14 DIAGNOSIS — F329 Major depressive disorder, single episode, unspecified: Secondary | ICD-10-CM | POA: Insufficient documentation

## 2015-05-14 DIAGNOSIS — E039 Hypothyroidism, unspecified: Secondary | ICD-10-CM | POA: Diagnosis not present

## 2015-05-14 DIAGNOSIS — G4730 Sleep apnea, unspecified: Secondary | ICD-10-CM | POA: Insufficient documentation

## 2015-05-14 DIAGNOSIS — T82594A Other mechanical complication of infusion catheter, initial encounter: Secondary | ICD-10-CM | POA: Insufficient documentation

## 2015-05-14 DIAGNOSIS — C259 Malignant neoplasm of pancreas, unspecified: Secondary | ICD-10-CM | POA: Diagnosis not present

## 2015-05-14 DIAGNOSIS — Z87891 Personal history of nicotine dependence: Secondary | ICD-10-CM | POA: Insufficient documentation

## 2015-05-14 DIAGNOSIS — Z79899 Other long term (current) drug therapy: Secondary | ICD-10-CM | POA: Insufficient documentation

## 2015-05-14 DIAGNOSIS — Y713 Surgical instruments, materials and cardiovascular devices (including sutures) associated with adverse incidents: Secondary | ICD-10-CM | POA: Insufficient documentation

## 2015-05-14 HISTORY — PX: PORTACATH PLACEMENT: SHX2246

## 2015-05-14 LAB — GLUCOSE, CAPILLARY
Glucose-Capillary: 95 mg/dL (ref 65–99)
Glucose-Capillary: 80 mg/dL (ref 65–99)

## 2015-05-14 LAB — POCT HEMOGLOBIN-HEMACUE: HEMOGLOBIN: 12.2 g/dL (ref 12.0–15.0)

## 2015-05-14 SURGERY — INSERTION, TUNNELED CENTRAL VENOUS DEVICE, WITH PORT
Anesthesia: General | Site: Chest

## 2015-05-14 MED ORDER — SODIUM CHLORIDE 0.9 % IJ SOLN: 3.0000 mL | INTRAMUSCULAR | Status: DC | PRN

## 2015-05-14 MED ORDER — CEFAZOLIN SODIUM-DEXTROSE 2-3 GM-% IV SOLR
INTRAVENOUS | Status: AC
  Filled 2015-05-14: qty 50

## 2015-05-14 MED ORDER — FENTANYL CITRATE (PF) 100 MCG/2ML IJ SOLN
25.0000 ug | INTRAMUSCULAR | Status: DC | PRN
  Administered 2015-05-14: 25 ug via INTRAVENOUS

## 2015-05-14 MED ORDER — CEFAZOLIN SODIUM-DEXTROSE 2-3 GM-% IV SOLR
2.0000 g | INTRAVENOUS | Status: AC
  Administered 2015-05-14: 2 g via INTRAVENOUS

## 2015-05-14 MED ORDER — LACTATED RINGERS IV SOLN
INTRAVENOUS | Status: DC
  Administered 2015-05-14 (×2): via INTRAVENOUS

## 2015-05-14 MED ORDER — SODIUM CHLORIDE 0.9 % IV SOLN: 250.0000 mL | INTRAVENOUS | Status: DC | PRN

## 2015-05-14 MED ORDER — HEPARIN SOD (PORK) LOCK FLUSH 100 UNIT/ML IV SOLN
INTRAVENOUS | Status: AC
  Filled 2015-05-14: qty 5

## 2015-05-14 MED ORDER — OXYCODONE HCL 5 MG PO TABS: 5.0000 mg | ORAL_TABLET | ORAL | Status: DC | PRN

## 2015-05-14 MED ORDER — FENTANYL CITRATE (PF) 100 MCG/2ML IJ SOLN
50.0000 ug | INTRAMUSCULAR | Status: DC | PRN
  Administered 2015-05-14: 50 ug via INTRAVENOUS

## 2015-05-14 MED ORDER — FENTANYL CITRATE (PF) 100 MCG/2ML IJ SOLN
INTRAMUSCULAR | Status: AC
  Filled 2015-05-14: qty 2

## 2015-05-14 MED ORDER — SCOPOLAMINE 1 MG/3DAYS TD PT72
1.0000 | MEDICATED_PATCH | Freq: Once | TRANSDERMAL | Status: DC | PRN
Start: 2015-05-14 — End: 2015-05-14

## 2015-05-14 MED ORDER — HEPARIN (PORCINE) IN NACL 2-0.9 UNIT/ML-% IJ SOLN
INTRAMUSCULAR | Status: DC | PRN
  Administered 2015-05-14: 1 via INTRAVENOUS

## 2015-05-14 MED ORDER — ONDANSETRON HCL 4 MG/2ML IJ SOLN: 4.0000 mg | Freq: Once | INTRAMUSCULAR | Status: DC | PRN

## 2015-05-14 MED ORDER — GLYCOPYRROLATE 0.2 MG/ML IJ SOLN
0.2000 mg | Freq: Once | INTRAMUSCULAR | Status: AC | PRN
  Administered 2015-05-14: 0.2 mg via INTRAVENOUS

## 2015-05-14 MED ORDER — PHENYLEPHRINE HCL 10 MG/ML IJ SOLN
INTRAMUSCULAR | Status: DC | PRN
  Administered 2015-05-14 (×2): 40 ug via INTRAVENOUS

## 2015-05-14 MED ORDER — HEPARIN SOD (PORK) LOCK FLUSH 100 UNIT/ML IV SOLN
INTRAVENOUS | Status: DC | PRN
  Administered 2015-05-14: 500 [IU] via INTRAVENOUS

## 2015-05-14 MED ORDER — FENTANYL CITRATE (PF) 100 MCG/2ML IJ SOLN
INTRAMUSCULAR | Status: AC
  Filled 2015-05-14: qty 4

## 2015-05-14 MED ORDER — MIDAZOLAM HCL 2 MG/2ML IJ SOLN: 1.0000 mg | INTRAMUSCULAR | Status: DC | PRN

## 2015-05-14 MED ORDER — DEXAMETHASONE SODIUM PHOSPHATE 4 MG/ML IJ SOLN
INTRAMUSCULAR | Status: DC | PRN
  Administered 2015-05-14: 5 mg via INTRAVENOUS

## 2015-05-14 MED ORDER — ACETAMINOPHEN 650 MG RE SUPP: 650.0000 mg | RECTAL | Status: DC | PRN

## 2015-05-14 MED ORDER — MIDAZOLAM HCL 2 MG/2ML IJ SOLN
INTRAMUSCULAR | Status: AC
  Filled 2015-05-14: qty 2

## 2015-05-14 MED ORDER — PROPOFOL 10 MG/ML IV BOLUS
INTRAVENOUS | Status: DC | PRN
  Administered 2015-05-14: 120 mg via INTRAVENOUS

## 2015-05-14 MED ORDER — LIDOCAINE HCL (CARDIAC) 20 MG/ML IV SOLN
INTRAVENOUS | Status: DC | PRN
  Administered 2015-05-14: 50 mg via INTRAVENOUS

## 2015-05-14 MED ORDER — ACETAMINOPHEN 325 MG PO TABS: 650.0000 mg | ORAL_TABLET | ORAL | Status: DC | PRN

## 2015-05-14 MED ORDER — HEPARIN (PORCINE) IN NACL 2-0.9 UNIT/ML-% IJ SOLN
INTRAMUSCULAR | Status: AC
  Filled 2015-05-14: qty 500

## 2015-05-14 MED ORDER — SODIUM CHLORIDE 0.9 % IJ SOLN: 3.0000 mL | Freq: Two times a day (BID) | INTRAMUSCULAR | Status: DC

## 2015-05-14 MED ORDER — BUPIVACAINE HCL (PF) 0.25 % IJ SOLN
INTRAMUSCULAR | Status: DC | PRN
  Administered 2015-05-14: 8 mL

## 2015-05-14 MED ORDER — ONDANSETRON HCL 4 MG/2ML IJ SOLN
INTRAMUSCULAR | Status: DC | PRN
  Administered 2015-05-14: 4 mg via INTRAVENOUS

## 2015-05-14 SURGICAL SUPPLY — 44 items
BAG DECANTER FOR FLEXI CONT (MISCELLANEOUS) ×3 IMPLANT
BLADE HEX COATED 2.75 (ELECTRODE) ×3 IMPLANT
BLADE SURG 11 STRL SS (BLADE) ×3 IMPLANT
BLADE SURG 15 STRL LF DISP TIS (BLADE) ×1 IMPLANT
BLADE SURG 15 STRL SS (BLADE) ×2
CHLORAPREP W/TINT 26ML (MISCELLANEOUS) ×3 IMPLANT
COVER BACK TABLE 60X90IN (DRAPES) ×3 IMPLANT
COVER MAYO STAND STRL (DRAPES) ×3 IMPLANT
DECANTER SPIKE VIAL GLASS SM (MISCELLANEOUS) IMPLANT
DRAPE C-ARM 42X72 X-RAY (DRAPES) ×3 IMPLANT
DRAPE LAPAROTOMY TRNSV 102X78 (DRAPE) ×3 IMPLANT
DRAPE UTILITY XL STRL (DRAPES) IMPLANT
DRSG TEGADERM 4X4.75 (GAUZE/BANDAGES/DRESSINGS) IMPLANT
ELECT REM PT RETURN 9FT ADLT (ELECTROSURGICAL) ×3
ELECTRODE REM PT RTRN 9FT ADLT (ELECTROSURGICAL) ×1 IMPLANT
GLOVE BIO SURGEON STRL SZ 6 (GLOVE) ×3 IMPLANT
GLOVE BIOGEL M STRL SZ7.5 (GLOVE) ×3 IMPLANT
GLOVE BIOGEL PI IND STRL 6.5 (GLOVE) ×2 IMPLANT
GLOVE BIOGEL PI IND STRL 8 (GLOVE) ×2 IMPLANT
GLOVE BIOGEL PI INDICATOR 6.5 (GLOVE) ×4
GLOVE BIOGEL PI INDICATOR 8 (GLOVE) ×4
GLOVE SURG SS PI 7.5 STRL IVOR (GLOVE) ×3 IMPLANT
GOWN STRL REUS W/ TWL LRG LVL3 (GOWN DISPOSABLE) ×1 IMPLANT
GOWN STRL REUS W/ TWL XL LVL3 (GOWN DISPOSABLE) ×2 IMPLANT
GOWN STRL REUS W/TWL 2XL LVL3 (GOWN DISPOSABLE) ×3 IMPLANT
GOWN STRL REUS W/TWL LRG LVL3 (GOWN DISPOSABLE) ×2
GOWN STRL REUS W/TWL XL LVL3 (GOWN DISPOSABLE) ×4
KIT PORT POWER 8FR ISP CVUE (Catheter) ×3 IMPLANT
LIQUID BAND (GAUZE/BANDAGES/DRESSINGS) ×3 IMPLANT
NEEDLE HYPO 25X1 1.5 SAFETY (NEEDLE) ×3 IMPLANT
PACK BASIN DAY SURGERY FS (CUSTOM PROCEDURE TRAY) ×3 IMPLANT
PENCIL BUTTON HOLSTER BLD 10FT (ELECTRODE) ×3 IMPLANT
SLEEVE SCD COMPRESS KNEE MED (MISCELLANEOUS) ×3 IMPLANT
SPONGE GAUZE 4X4 12PLY STER LF (GAUZE/BANDAGES/DRESSINGS) IMPLANT
SUT MNCRL AB 4-0 PS2 18 (SUTURE) ×3 IMPLANT
SUT PROLENE 2 0 SH DA (SUTURE) ×6 IMPLANT
SUT VIC AB 3-0 SH 27 (SUTURE) ×2
SUT VIC AB 3-0 SH 27X BRD (SUTURE) ×1 IMPLANT
SUT VICRYL 3-0 CR8 SH (SUTURE) IMPLANT
SYR 5ML LUER SLIP (SYRINGE) ×3 IMPLANT
SYR CONTROL 10ML LL (SYRINGE) ×3 IMPLANT
SYRINGE 10CC LL (SYRINGE) ×6 IMPLANT
TOWEL OR 17X24 6PK STRL BLUE (TOWEL DISPOSABLE) ×3 IMPLANT
TOWEL OR NON WOVEN STRL DISP B (DISPOSABLE) ×3 IMPLANT

## 2015-05-14 NOTE — Op Note (Signed)
PREOPERATIVE DIAGNOSIS:  Malfunction left subclavian port a cath.       POSTOPERATIVE DIAGNOSIS:  Same     PROCEDURE: Replacement of left subclavian port placement, [employer]   Power Port, MRI safe, 8-French.      SURGEON:  Stark Klein, MD      ANESTHESIA:  General   FINDINGS:  Good venous return, easy flush, and tip of the catheter and   SVC 20.5 cm.      SPECIMEN:  None.      ESTIMATED BLOOD LOSS:  Minimal.      COMPLICATIONS:  None known.      PROCEDURE:  Pt was identified in the holding area and taken to   the operating room, where patient was placed supine on the operating room   table.  General anesthesia was induced.  Patient's arms were tucked and the upper   chest and neck were prepped and draped in sterile fashion.  Time-out was   performed according to the surgical safety check list.  When all was   correct, we continued.   Local anesthetic was administered over the previous incision.  The previous incision was opened.  The port was exposed and elevated.  The sutures holding it in place were removed.    The catheter was clamped and transected.  The catheter was also dissected out at the infraclavicular puncture site and the distal portion was pulled out that site.  The wire was advanced through the previous catheter, and initially it would not pass.  The catheter was retracted around 5 cm and the wire then passed easily.  The previous catheter was removed.  Fluoroscopy was used to confirm that the wire was in the vena cava. The subclavian vein did not require a puncture site in order to avoid the risk of pneumothorax.        An [employer] retractor was used to elevate the skin   while a pocket was created on top of the pectoralis fascia.  The port   was placed into the pocket to confirm that it was of adequate size.  The   catheter was preattached to the port.  The port was then secured to the   pectoralis fascia with four 2-0 Prolene sutures.  These were clamped and   not tied down yet.    The catheter was tunneled through to the wire exit   site.  The catheter was placed along the wire to determine what length it should be to be in the SVC.  The catheter was cut at 20.5 cm.  The tunneler sheath and dilator were passed over the wire and the dilator and wire were removed.  The catheter was advanced through the tunneler sheath and the tunneler sheath was pulled away.  Care was taken to keep the catheter in the tunneler sheath as this occurred. This was advanced and the tunneler sheath was removed.  There was good venous   return and easy flush of the catheter.  The Prolene sutures were tied   down to the pectoral fascia.  The skin was reapproximated using 3-0   Vicryl interrupted deep dermal sutures.    Fluoroscopy was used to re-confirm good position of the catheter.  The skin   was then closed using 4-0 Monocryl in a subcuticular fashion.  The port was flushed with concentrated heparin flush as well.  The wounds were then cleaned, dried, and dressed with Dermabond.  The patient was awakened from anesthesia and taken to the  PACU in stable condition.  Needle, sponge, and instrument counts were correct.               Stark Klein, MD

## 2015-05-14 NOTE — Anesthesia Procedure Notes (Signed)
Procedure Name: LMA Insertion Date/Time: 05/14/2015 3:25 PM Performed by: Lieutenant Diego Pre-anesthesia Checklist: Patient identified, Emergency Drugs available, Suction available and Patient being monitored Patient Re-evaluated:Patient Re-evaluated prior to inductionOxygen Delivery Method: Circle System Utilized Preoxygenation: Pre-oxygenation with 100% oxygen Intubation Type: IV induction Ventilation: Mask ventilation without difficulty LMA: LMA inserted LMA Size: 3.0 Number of attempts: 1 Airway Equipment and Method: Bite block Placement Confirmation: positive ETCO2 and breath sounds checked- equal and bilateral Tube secured with: Tape Dental Injury: Teeth and Oropharynx as per pre-operative assessment

## 2015-05-14 NOTE — H&P (Signed)
Chief Complaint: pancreatic cancer, non functional port.   HPI:  Pt has port that will not aspirate and appears to be at the junction of the SVC/brachiocephalic vein.  There is likely a thrombin sheath.    Past Medical History  Diagnosis Date  . Depression     pt denies 01/29/13  . Arthritis     chronic pain  . Hypothyroidism   . Adenomatous colon polyp 12/2005  . Skin cancer   . Sleep apnea     mild-does not use cpap-does snore    Past Surgical History  Procedure Laterality Date  . Bunionectomy Bilateral     with joint fussion and bone debreisement  . Tonsilectomy, adenoidectomy, bilateral myringotomy and tubes    . Open reduction internal fixation (orif) distal radial fracture Right 01/01/2013    Procedure: OPEN REDUCTION INTERNAL FIXATION (ORIF) DISTAL RADIAL FRACTURE;  Surgeon: Wynonia Sours, MD;  Location: [facility];  Service: Orthopedics;  Laterality: Right;  . Tubal ligation    . Wrist fracture surgery Right     Family History  Problem Relation Age of Onset  . COPD Mother   . Heart disease Mother   . Kidney disease Mother   . COPD Father   . Tuberculosis Father   . Colon cancer Brother     colon and esophageal cancer   . Thyroid cancer Daughter   . Cancer Daughter 16    thyroid cancer    Social History:  reports that she quit smoking about 45 years ago. Her smoking use included Cigarettes. She has never used smokeless tobacco. She reports that she drinks alcohol. She reports that she does not use illicit drugs.  Allergies:  Allergies  Allergen Reactions  . Cephalexin     REACTION: rash    Medications Prior to Admission  Medication Sig Dispense Refill  . acyclovir (ZOVIRAX) 400 MG tablet Take 1 pill 3x a day for 5 days. Use prn outbreak 45 tablet 2  . acyclovir ointment (ZOVIRAX) 5 % Apply up to 6x a day as needed for outbreak 30 g 1  . Ascorbic Acid (VITAMIN C) 1000 MG tablet Take 1,000 mg by mouth daily.    Marland Kitchen b complex vitamins tablet  Take 1 tablet by mouth daily.    . Biotin 1000 MCG tablet Take 1,000 mcg by mouth daily.    . Calcium 500 MG CHEW Chew 2 each by mouth daily.    . cholecalciferol (VITAMIN D) 1000 UNITS tablet Take 4,000 Units by mouth daily.     . clotrimazole (LOTRIMIN) 1 % cream Apply 1 application topically 2 (two) times daily. 60 g 2  . cyanocobalamin 500 MCG tablet Take 500 mcg by mouth daily.    Marland Kitchen DHEA 25 MG CAPS Take 25 mg by mouth daily.    . Flaxseed, Linseed, (FLAX SEED OIL PO) Take by mouth daily. 3 tablespoons daily    . levothyroxine (SYNTHROID, LEVOTHROID) 125 MCG tablet Take 1 tablet (125 mcg total) by mouth daily. 90 tablet 3  . Magnesium 400 MG CAPS Take 400 mg by mouth daily.    . meloxicam (MOBIC) 15 MG tablet TAKE ONE TABLET BY MOUTH ONCE DAILY AS NEEDED FOR PAIN 30 tablet 10  . OVER THE COUNTER MEDICATION Take 15 mLs by mouth daily. Anson Crofts    . triamcinolone cream (KENALOG) 0.1 % Apply 1 application topically 2 (two) times daily. 60 g 1    No results found for this  or any previous visit (from the past 48 hour(s)). No results found.  Review of Systems  All other systems reviewed and are negative.   Blood pressure 180/81, pulse 58, temperature 98.2 F (36.8 C), temperature source Oral, resp. rate 18, height 5\' 3"  (1.6 m), weight 85.446 kg (188 lb 6 oz), SpO2 99 %. Physical Exam  Constitutional: She is oriented to person, place, and time. She appears well-developed. No distress.  Thin   HENT:  Head: Normocephalic and atraumatic.  Eyes: Conjunctivae are normal. Pupils are equal, round, and reactive to light. No scleral icterus.  Neck: Normal range of motion.  Cardiovascular: Normal rate.   Respiratory: Effort normal. No respiratory distress.  GI: Soft. Bowel sounds are normal. She exhibits no distension. There is no tenderness.  Musculoskeletal: Normal range of motion.  Neurological: She is alert and oriented to person, place, and time.  Skin: Skin is warm and dry. No  rash noted. She is not diaphoretic. No erythema. No pallor.  Psychiatric: She has a normal mood and affect. Her behavior is normal. Judgment and thought content normal.     Assessment/Plan Pancreatic cancer Non functional port a cath.    Plan interrogate port and replace vs revise.    Reviewed risks and benefits.    Shelley Graves 05/14/2015, 1:58 PM

## 2015-05-14 NOTE — Anesthesia Postprocedure Evaluation (Signed)
  Anesthesia Post-op Note  Patient: Shelley Graves  Procedure(s) Performed: Procedure(s): REPLACEMENT OF LEFT SUBCLAVIAN PORT A CATH (N/A)  Patient Location: PACU  Anesthesia Type:General  Level of Consciousness: awake, alert  and oriented  Airway and Oxygen Therapy: Patient Spontanous Breathing  Post-op Pain: mild  Post-op Assessment: Post-op Vital signs reviewed, Patient's Cardiovascular Status Stable, Respiratory Function Stable, Patent Airway and Pain level controlled              Post-op Vital Signs: stable  Last Vitals:  Filed Vitals:   05/14/15 1645  BP: 127/52  Pulse: 73  Temp:   Resp: 8    Complications: No apparent complications

## 2015-05-14 NOTE — Anesthesia Preprocedure Evaluation (Signed)
Anesthesia Evaluation  Patient identified by MRN, date of birth, ID band Patient awake    Reviewed: Allergy & Precautions, NPO status , Patient's Chart, lab work & pertinent test results  Airway Mallampati: II  TM Distance: >3 FB Neck ROM: Full    Dental  (+) Edentulous Upper, Edentulous Lower   Pulmonary  breath sounds clear to auscultation        Cardiovascular hypertension, Rhythm:Regular Rate:Normal     Neuro/Psych    GI/Hepatic   Endo/Other  diabetes  Renal/GU      Musculoskeletal   Abdominal   Peds  Hematology   Anesthesia Other Findings   Reproductive/Obstetrics                             Anesthesia Physical Anesthesia Plan  ASA: III  Anesthesia Plan: General   Post-op Pain Management:    Induction: Intravenous  Airway Management Planned: LMA  Additional Equipment:   Intra-op Plan:   Post-operative Plan:   Informed Consent: I have reviewed the patients History and Physical, chart, labs and discussed the procedure including the risks, benefits and alternatives for the proposed anesthesia with the patient or authorized representative who has indicated his/her understanding and acceptance.     Plan Discussed with: Anesthesiologist and CRNA  Anesthesia Plan Comments: (Nonfunctioning portacath Stage 4 pancreatic Ca Type 2 DM glucose 120 Hypertension  Plan GA with LMA)        Anesthesia Quick Evaluation

## 2015-05-14 NOTE — Discharge Instructions (Addendum)
[facility] °Office Phone Number [phone] ° ° POST OP INSTRUCTIONS ° °Always review your discharge instruction sheet given to you by the facility where your surgery was performed. ° °IF YOU HAVE DISABILITY OR FAMILY LEAVE FORMS, YOU MUST BRING THEM TO THE OFFICE FOR PROCESSING.  DO NOT GIVE THEM TO YOUR DOCTOR. ° °1. A prescription for pain medication may be given to you upon discharge.  Take your pain medication as prescribed, if needed.  If narcotic pain medicine is not needed, then you may take acetaminophen (Tylenol) or ibuprofen (Advil) as needed. °2. Take your usually prescribed medications unless otherwise directed °3. If you need a refill on your pain medication, please contact your pharmacy.  They will contact our office to request authorization.  Prescriptions will not be filled after 5pm or on week-ends. °4. You should eat very light the first 24 hours after surgery, such as soup, crackers, pudding, etc.  Resume your normal diet the day after surgery °5. It is common to experience some constipation if taking pain medication after surgery.  Increasing fluid intake and taking a stool softener will usually help or prevent this problem from occurring.  A mild laxative (Milk of Magnesia or Miralax) should be taken according to package directions if there are no bowel movements after 48 hours. °6. You may shower in 48 hours.  The surgical glue will flake off in 2-3 weeks.   °7. ACTIVITIES:  No strenuous activity or heavy lifting for 1 week.   °a. You may drive when you no longer are taking prescription pain medication, you can comfortably wear a seatbelt, and you can safely maneuver your car and apply brakes. °b. RETURN TO WORK:  __________to be determined._______________ °You should see your doctor in the office for a follow-up appointment approximately three-four weeks after your surgery.   ° °WHEN TO CALL YOUR DOCTOR: °1. Fever over 101.0 °2. Nausea and/or vomiting. °3. Extreme swelling  or bruising. °4. Continued bleeding from incision. °5. Increased pain, redness, or drainage from the incision. ° °The clinic staff is available to answer your questions during regular business hours.  Please don’t hesitate to call and ask to speak to one of the nurses for clinical concerns.  If you have a medical emergency, go to the nearest emergency room or call 911.  A surgeon from [facility] is always on call at the hospital. ° °For further questions, please visit [email or web address]  ° ° ° ° °Post Anesthesia Home Care Instructions ° °Activity: °Get plenty of rest for the remainder of the day. A responsible adult should stay with you for 24 hours following the procedure.  °For the next 24 hours, DO NOT: °-Drive a car °-Operate machinery °-Drink alcoholic beverages °-Take any medication unless instructed by your physician °-Make any legal decisions or sign important papers. ° °Meals: °Start with liquid foods such as gelatin or soup. Progress to regular foods as tolerated. Avoid greasy, spicy, heavy foods. If nausea and/or vomiting occur, drink only clear liquids until the nausea and/or vomiting subsides. Call your physician if vomiting continues. ° °Special Instructions/Symptoms: °Your throat may feel dry or sore from the anesthesia or the breathing tube placed in your throat during surgery. If this causes discomfort, gargle with warm salt water. The discomfort should disappear within 24 hours. ° °If you had a scopolamine patch placed behind your ear for the management of post- operative nausea and/or vomiting: ° °1. The medication in the patch is effective for 72 hours, after   which it should be removed.  Wrap patch in a tissue and discard in the trash. Wash hands thoroughly with soap and water. °2. You may remove the patch earlier than 72 hours if you experience unpleasant side effects which may include dry mouth, dizziness or visual disturbances. °3. Avoid touching the patch. Wash your  hands with soap and water after contact with the patch. °  ° ° °

## 2015-05-14 NOTE — Transfer of Care (Signed)
Immediate Anesthesia Transfer of Care Note  Patient: Shelley Graves  Procedure(s) Performed: Procedure(s): REPLACEMENT OF LEFT SUBCLAVIAN PORT A CATH (N/A)  Patient Location: PACU  Anesthesia Type:General  Level of Consciousness: awake  Airway & Oxygen Therapy: Patient Spontanous Breathing and Patient connected to face mask oxygen  Post-op Assessment: Report given to RN and Post -op Vital signs reviewed and stable  Post vital signs: Reviewed and stable  Last Vitals:  Filed Vitals:   05/14/15 1421  BP: 124/53  Pulse: 80  Temp: 36.5 C  Resp: 16    Complications: No apparent anesthesia complications

## 2015-05-15 ENCOUNTER — Other Ambulatory Visit: Payer: Self-pay | Admitting: Radiology

## 2015-05-15 ENCOUNTER — Encounter (HOSPITAL_BASED_OUTPATIENT_CLINIC_OR_DEPARTMENT_OTHER): Payer: Self-pay | Admitting: General Surgery

## 2015-05-16 ENCOUNTER — Other Ambulatory Visit: Payer: Self-pay | Admitting: Radiology

## 2015-05-19 ENCOUNTER — Ambulatory Visit (HOSPITAL_COMMUNITY)
Admission: RE | Admit: 2015-05-19 | Discharge: 2015-05-19 | Disposition: A | Discharge status: Home / Self Care | Payer: Medicare Other | Source: Ambulatory Visit | Attending: Interventional Radiology | Admitting: Interventional Radiology

## 2015-05-19 ENCOUNTER — Encounter (HOSPITAL_COMMUNITY): Payer: Self-pay

## 2015-05-19 DIAGNOSIS — C250 Malignant neoplasm of head of pancreas: Secondary | ICD-10-CM | POA: Diagnosis not present

## 2015-05-19 DIAGNOSIS — C259 Malignant neoplasm of pancreas, unspecified: Secondary | ICD-10-CM

## 2015-05-19 LAB — CBC
HCT: 36.7 % (ref 36.0–46.0)
HEMOGLOBIN: 12.1 g/dL (ref 12.0–15.0)
MCH: 28.5 pg (ref 26.0–34.0)
MCHC: 33 g/dL (ref 30.0–36.0)
MCV: 86.4 fL (ref 78.0–100.0)
Platelets: 148 10*3/uL — ABNORMAL LOW (ref 150–400)
RBC: 4.25 MIL/uL (ref 3.87–5.11)
RDW: 13.6 % (ref 11.5–15.5)
WBC: 6.2 10*3/uL (ref 4.0–10.5)

## 2015-05-19 LAB — APTT: APTT: 25 s (ref 24–37)

## 2015-05-19 LAB — PROTIME-INR
INR: 0.95 (ref 0.00–1.49)
PROTHROMBIN TIME: 12.9 s (ref 11.6–15.2)

## 2015-05-19 LAB — GLUCOSE, CAPILLARY: Glucose-Capillary: 100 mg/dL — ABNORMAL HIGH (ref 65–99)

## 2015-05-19 MED ORDER — HYDROMORPHONE HCL 2 MG/ML IJ SOLN: 2.0000 mg | INTRAMUSCULAR | Status: DC | PRN

## 2015-05-19 MED ORDER — ALCOHOL 98 % IV SOLN
INTRAVENOUS | Status: AC | PRN
  Administered 2015-05-19: 30 mL

## 2015-05-19 MED ORDER — SODIUM CHLORIDE 0.9 % IV SOLN
Freq: Once | INTRAVENOUS | Status: AC
  Administered 2015-05-19: 09:00:00 via INTRAVENOUS

## 2015-05-19 MED ORDER — HYDROMORPHONE HCL 2 MG/ML IJ SOLN
INTRAMUSCULAR | Status: AC
  Filled 2015-05-19: qty 1

## 2015-05-19 MED ORDER — FENTANYL CITRATE (PF) 100 MCG/2ML IJ SOLN
INTRAMUSCULAR | Status: AC
  Filled 2015-05-19: qty 4

## 2015-05-19 MED ORDER — FENTANYL CITRATE (PF) 100 MCG/2ML IJ SOLN
INTRAMUSCULAR | Status: AC | PRN
  Administered 2015-05-19: 50 ug via INTRAVENOUS
  Administered 2015-05-19 (×2): 25 ug via INTRAVENOUS
  Administered 2015-05-19: 50 ug via INTRAVENOUS

## 2015-05-19 MED ORDER — OXYCODONE-ACETAMINOPHEN 7.5-325 MG PO TABS
2.0000 | ORAL_TABLET | ORAL | Status: DC | PRN
  Administered 2015-05-19: 2 via ORAL
  Filled 2015-05-19 (×3): qty 2

## 2015-05-19 MED ORDER — MIDAZOLAM HCL 2 MG/2ML IJ SOLN
INTRAMUSCULAR | Status: AC
  Filled 2015-05-19: qty 6

## 2015-05-19 MED ORDER — HYDROMORPHONE HCL 1 MG/ML IJ SOLN
INTRAMUSCULAR | Status: AC | PRN
  Administered 2015-05-19: 1 mg via INTRAVENOUS

## 2015-05-19 MED ORDER — METHYLPREDNISOLONE SODIUM SUCC 125 MG IJ SOLR
80.0000 mg | Freq: Once | INTRAMUSCULAR | Status: DC
  Filled 2015-05-19: qty 1.28

## 2015-05-19 MED ORDER — MIDAZOLAM HCL 2 MG/2ML IJ SOLN
INTRAMUSCULAR | Status: AC | PRN
  Administered 2015-05-19 (×3): 0.5 mg via INTRAVENOUS
  Administered 2015-05-19: 1 mg via INTRAVENOUS
  Administered 2015-05-19 (×3): 0.5 mg via INTRAVENOUS

## 2015-05-19 MED ORDER — HYDROMORPHONE HCL 1 MG/ML IJ SOLN
INTRAMUSCULAR | Status: AC
  Administered 2015-05-19: 1 mg
  Filled 2015-05-19: qty 2

## 2015-05-19 MED ORDER — BUPIVACAINE HCL (PF) 0.25 % IJ SOLN
INTRAMUSCULAR | Status: AC | PRN
  Administered 2015-05-19: 10 mL via PERINEURAL

## 2015-05-19 MED ORDER — ALCOHOL 98 % IV SOLN
50.0000 mL | Freq: Once | INTRAVENOUS | Status: DC
  Filled 2015-05-19: qty 50

## 2015-05-19 MED ORDER — BUPIVACAINE HCL (PF) 0.25 % IJ SOLN
30.0000 mL | Freq: Once | INTRAMUSCULAR | Status: DC
  Filled 2015-05-19: qty 30

## 2015-05-19 NOTE — Discharge Instructions (Signed)
Incision Care  An incision is a surgical cut to open your skin. You need to take care of your incision. This helps you to not get an infection. HOME CARE  Only take medicine as told by your doctor.  Do not take off your bandage (dressing) or get your incision wet until your doctor approves. Change the bandage and call your doctor if the bandage gets wet, dirty, or starts to smell.  Take showers. Do not take baths, swim, or do anything that may soak your incision until it heals.  Return to your normal diet and activities as told or allowed by your doctor.  Avoid lifting any weight until your doctor approves.  Put medicine that helps lessen itching on your incision as told by your doctor. Do not pick or scratch at your incision.  Keep your doctor visit to have your stitches (sutures) or staples removed.  Drink enough fluids to keep your pee (urine) clear or pale yellow. GET HELP RIGHT AWAY IF:  You have a fever.  You have a rash.  You are dizzy, or you pass out (faint) while standing.  You have trouble breathing.  You have a reaction or side effects to medicine given to you.  You have redness, puffiness (swelling), or more pain in the incision and medicine does not help.  You have fluid, blood, or yellowish-white fluid (pus) coming from the incision lasting over 1 day.  You have muscle aches, chills, or you feel sick.  You have a bad smell coming from the incision or bandage.  Your incision opens up after stitches, staples, or sticky strips have been removed.  You keep feeling sick to your stomach (nauseous) or keep throwing up (vomiting). MAKE SURE YOU:   Understand these instructions.  Will watch your condition.  Will get help right away if you are not doing well or get worse. Document Released: 01/17/2012 Document Reviewed: 12/19/2013 [facility] Patient Information 2015 [place]. This information is not intended to replace advice given to you by your health  care provider. Make sure you discuss any questions you have with your health care provider.     Conscious Sedation, Adult, Care After Refer to this sheet in the next few weeks. These instructions provide you with information on caring for yourself after your procedure. Your health care provider may also give you more specific instructions. Your treatment has been planned according to current medical practices, but problems sometimes occur. Call your health care provider if you have any problems or questions after your procedure. WHAT TO EXPECT AFTER THE PROCEDURE  After your procedure:  You may feel sleepy, clumsy, and have poor balance for several hours.  Vomiting may occur if you eat too soon after the procedure. HOME CARE INSTRUCTIONS  Do not participate in any activities where you could become injured for at least 24 hours. Do not:  Drive.  Swim.  Ride a bicycle.  Operate heavy machinery.  Cook.  Use power tools.  Climb ladders.  Work from a high place.  Do not make important decisions or sign legal documents until you are improved.  If you vomit, drink water, juice, or soup when you can drink without vomiting. Make sure you have little or no nausea before eating solid foods.  Only take over-the-counter or prescription medicines for pain, discomfort, or fever as directed by your health care provider.  Make sure you and your family fully understand everything about the medicines given to you, including what side effects may  occur.  You should not drink alcohol, take sleeping pills, or take medicines that cause drowsiness for at least 24 hours.  If you smoke, do not smoke without supervision.  If you are feeling better, you may resume normal activities 24 hours after you were sedated.  Keep all appointments with your health care provider. SEEK MEDICAL CARE IF:  Your skin is pale or bluish in color.  You continue to feel nauseous or vomit.  Your pain is getting  worse and is not helped by medicine.  You have bleeding or swelling.  You are still sleepy or feeling clumsy after 24 hours. SEEK IMMEDIATE MEDICAL CARE IF:  You develop a rash.  You have difficulty breathing.  You develop any type of allergic problem.  You have a fever. MAKE SURE YOU:  Understand these instructions.  Will watch your condition.  Will get help right away if you are not doing well or get worse. Document Released: 08/15/2013 Document Reviewed: 08/15/2013 [facility] Patient Information 2015 [place], [place]. This information is not intended to replace advice given to you by your health care provider. Make sure you discuss any questions you have with your health care provider.  [employer] Patient Information 2015 [place]. This information is not intended to replace advice given to you by your health care provider. Make sure you discuss any questions you have with your health care provider.

## 2015-05-19 NOTE — Procedures (Signed)
CT guided celiac plexus block and neurolysis No complication No blood loss. See complete dictation in [facility].

## 2015-05-19 NOTE — Progress Notes (Signed)
Patient did not want additional pain meds after receiving Dilaudid and Percocet. She stated her pain is at a tolerable level and would like to go home where she would rest more comfortably. Vitals signs stable. Dr. Vernard Gambles notified. OK to discharge home.

## 2015-05-19 NOTE — Progress Notes (Signed)
Patient ID: Shelley Graves, female   DOB: 1935/01/07, 80 y.o.   MRN: [id number]    Referring Physician(s): Neijstrom,E  Subjective: Pt familiar to IR service from prior celiac plexus neurolysis on 04/10/15 secondary to persistent abdominal pain from unresectable pancreatic cancer despite multiple narcotic drug regimen. She continues to have abdominal pain with radiation to back and following recent IR f/u visit with Dr. Vernard Gambles she presents today for repeat CT guided celiac plexus neurolysis.     Allergies: Codeine; Hydrocodone; Lisinopril; Simvastatin; and Morphine and related  Medications: Prior to Admission medications   Medication Sig Start Date End Date Taking? Authorizing Provider  esomeprazole (NEXIUM) 20 MG capsule Take 20 mg by mouth daily at 12 noon.   Yes Historical Provider, MD  fentaNYL (DURAGESIC - DOSED MCG/HR) 75 MCG/HR Place 75 mcg onto the skin every 3 (three) days.   Yes Historical Provider, MD  gabapentin (NEURONTIN) 300 MG capsule Take 600 mg by mouth 3 (three) times daily.    Yes Historical Provider, MD  ibuprofen (ADVIL,MOTRIN) 200 MG tablet Take 800 mg by mouth every 6 (six) hours as needed for moderate pain.   Yes Historical Provider, MD  losartan (COZAAR) 50 MG tablet Take 50 mg by mouth 2 (two) times daily.  11/09/13  Yes Historical Provider, MD  lovastatin (MEVACOR) 20 MG tablet Take 20 mg by mouth at bedtime.   Yes Historical Provider, MD  metFORMIN (GLUCOPHAGE-XR) 750 MG 24 hr tablet Take 1,500 mg by mouth every evening.    Yes Historical Provider, MD  ondansetron (ZOFRAN) 4 MG tablet Take 4 mg by mouth every 8 (eight) hours as needed for nausea.  11/30/13  Yes Historical Provider, MD  oxyCODONE-acetaminophen (PERCOCET/ROXICET) 5-325 MG per tablet Take 1 tablet by mouth every 4 (four) hours as needed for severe pain. 01/11/14  Yes Stark Klein, MD  pioglitazone (ACTOS) 15 MG tablet Take 15 mg by mouth every morning.  12/09/13  Yes Historical Provider, MD  potassium  chloride (K-DUR) 10 MEQ tablet Take 10 mEq by mouth daily.   Yes Historical Provider, MD  rOPINIRole (REQUIP) 0.5 MG tablet Take 0.5 mg by mouth at bedtime.  11/08/13   Historical Provider, MD  traMADol-acetaminophen (ULTRACET) 37.5-325 MG per tablet Take 1 tablet by mouth every 6 (six) hours as needed for moderate pain.  12/09/13   Historical Provider, MD  verapamil (CALAN) 80 MG tablet Take 80 mg by mouth 2 (two) times daily.  11/22/13   Historical Provider, MD     Vital Signs: BP 119/79 mmHg  Pulse 88  Temp(Src) 98.5 F (36.9 C) (Oral)  Resp 16  Ht 4\' 8"  (1.422 m)  Wt 103 lb (46.72 kg)  BMI 23.10 kg/m2  SpO2 96%  Physical Exam  Constitutional: She is oriented to person, place, and time. She appears well-developed and well-nourished.  Cardiovascular: Normal rate and regular rhythm.   Intact left chest wall PAC,NT  Pulmonary/Chest: Effort normal and breath sounds normal.  Abdominal: Soft. Bowel sounds are normal. There is tenderness.  Musculoskeletal: Normal range of motion. She exhibits no edema.  Neurological: She is alert and oriented to person, place, and time.    Imaging: No results found.  Labs:  CBC:  Recent Labs  04/10/15 1200 05/14/15 1432 05/19/15 0930  WBC 4.8  --  6.2  HGB 11.3* 12.2 12.1  HCT 34.8*  --  36.7  PLT 129*  --  148*    COAGS:  Recent Labs  04/10/15 1200 05/19/15 0930  INR 0.99 0.95  APTT 27 25    BMP:  Recent Labs  05/13/15 1210  NA 135  K 5.0  CL 101  CO2 28  GLUCOSE 120*  BUN 10  CALCIUM 8.9  CREATININE 1.14*  GFRNONAA 45*  GFRAA 52*    LIVER FUNCTION TESTS: No results for input(s): BILITOT, AST, ALT, ALKPHOS, PROT, ALBUMIN in the last 8760 hours.  Assessment and Plan: Pt with hx unresectable pancreatic cancer and persistent abd/back pain despite multiple narcotic drug regimen and CT guided celiac plexus neurolysis on 04/10/15. She has been seen in f/u with Dr. Vernard Gambles and now presents for repeat CT guided celiac plexus  neurolysis to attempt to better control pain level.    Signed: D. Rowe Robert 05/19/2015, 10:18 AM   I spent a total of 15 minutes in face to face in clinical consultation/evaluation, greater than 50% of which was counseling/coordinating care for CT guided celiac plexus neurolysis

## 2015-05-19 NOTE — Progress Notes (Signed)
Notified Dr. Vernard Gambles of pt's c/o severe pain in upper abdomen, and pain in left neck, pt very tearful.  Pt asking "Can she be put back to sleep so I can't feel the pain".  New orders given

## 2015-06-12 ENCOUNTER — Telehealth: Payer: Self-pay | Admitting: Radiology

## 2015-06-12 NOTE — Telephone Encounter (Signed)
Left message on H # requesting patient call for status update 3 wks s/p Celiac Plexus Block.  Makelle Marrone Riki Rusk, RN 06/12/2015 12:11 PM

## 2016-06-08 DEATH — deceased
